# Patient Record
Sex: Female | Born: 1965 | Race: White | Hispanic: No | Marital: Married | Smoking: Current every day smoker
Health system: Southern US, Community
[De-identification: ages and names within clinical notes are randomized; demographics above are authoritative.]

## PROBLEM LIST (undated history)

## (undated) DIAGNOSIS — M419 Scoliosis, unspecified: Secondary | ICD-10-CM

## (undated) DIAGNOSIS — L719 Rosacea, unspecified: Secondary | ICD-10-CM

## (undated) DIAGNOSIS — Z91018 Allergy to other foods: Secondary | ICD-10-CM

## (undated) DIAGNOSIS — I10 Essential (primary) hypertension: Secondary | ICD-10-CM

## (undated) DIAGNOSIS — K769 Liver disease, unspecified: Secondary | ICD-10-CM

## (undated) HISTORY — PX: TUBAL LIGATION: SHX77

## (undated) HISTORY — PX: BREAST SURGERY: SHX581

## (undated) HISTORY — DX: Liver disease, unspecified: K76.9

---

## 2008-05-08 ENCOUNTER — Ambulatory Visit: Payer: Self-pay | Admitting: Obstetrics and Gynecology

## 2019-09-11 ENCOUNTER — Encounter: Payer: Self-pay | Admitting: Emergency Medicine

## 2019-09-11 ENCOUNTER — Inpatient Hospital Stay
Admission: EM | Admit: 2019-09-11 | Discharge: 2019-09-17 | DRG: 433 | Disposition: A | Discharge status: Home / Self Care | Payer: Managed Care, Other (non HMO) | Attending: Internal Medicine | Admitting: Internal Medicine

## 2019-09-11 ENCOUNTER — Other Ambulatory Visit: Payer: Self-pay

## 2019-09-11 ENCOUNTER — Emergency Department: Payer: Managed Care, Other (non HMO)

## 2019-09-11 DIAGNOSIS — K760 Fatty (change of) liver, not elsewhere classified: Secondary | ICD-10-CM | POA: Diagnosis present

## 2019-09-11 DIAGNOSIS — Z9851 Tubal ligation status: Secondary | ICD-10-CM

## 2019-09-11 DIAGNOSIS — F411 Generalized anxiety disorder: Secondary | ICD-10-CM | POA: Diagnosis present

## 2019-09-11 DIAGNOSIS — Z20822 Contact with and (suspected) exposure to covid-19: Secondary | ICD-10-CM | POA: Diagnosis present

## 2019-09-11 DIAGNOSIS — E876 Hypokalemia: Secondary | ICD-10-CM | POA: Diagnosis present

## 2019-09-11 DIAGNOSIS — Z79899 Other long term (current) drug therapy: Secondary | ICD-10-CM

## 2019-09-11 DIAGNOSIS — K7011 Alcoholic hepatitis with ascites: Secondary | ICD-10-CM | POA: Diagnosis not present

## 2019-09-11 DIAGNOSIS — E871 Hypo-osmolality and hyponatremia: Secondary | ICD-10-CM | POA: Diagnosis present

## 2019-09-11 DIAGNOSIS — F1020 Alcohol dependence, uncomplicated: Secondary | ICD-10-CM | POA: Diagnosis present

## 2019-09-11 DIAGNOSIS — Z7982 Long term (current) use of aspirin: Secondary | ICD-10-CM

## 2019-09-11 DIAGNOSIS — T380X5A Adverse effect of glucocorticoids and synthetic analogues, initial encounter: Secondary | ICD-10-CM | POA: Diagnosis present

## 2019-09-11 DIAGNOSIS — R17 Unspecified jaundice: Secondary | ICD-10-CM | POA: Diagnosis present

## 2019-09-11 DIAGNOSIS — K759 Inflammatory liver disease, unspecified: Secondary | ICD-10-CM

## 2019-09-11 DIAGNOSIS — E441 Mild protein-calorie malnutrition: Secondary | ICD-10-CM | POA: Diagnosis present

## 2019-09-11 DIAGNOSIS — Y904 Blood alcohol level of 80-99 mg/100 ml: Secondary | ICD-10-CM | POA: Diagnosis present

## 2019-09-11 DIAGNOSIS — K766 Portal hypertension: Secondary | ICD-10-CM | POA: Diagnosis present

## 2019-09-11 DIAGNOSIS — D6959 Other secondary thrombocytopenia: Secondary | ICD-10-CM | POA: Diagnosis present

## 2019-09-11 DIAGNOSIS — D72829 Elevated white blood cell count, unspecified: Secondary | ICD-10-CM | POA: Diagnosis present

## 2019-09-11 DIAGNOSIS — K7460 Unspecified cirrhosis of liver: Secondary | ICD-10-CM | POA: Diagnosis present

## 2019-09-11 DIAGNOSIS — Z6834 Body mass index (BMI) 34.0-34.9, adult: Secondary | ICD-10-CM

## 2019-09-11 DIAGNOSIS — R531 Weakness: Secondary | ICD-10-CM | POA: Diagnosis not present

## 2019-09-11 DIAGNOSIS — F17200 Nicotine dependence, unspecified, uncomplicated: Secondary | ICD-10-CM | POA: Diagnosis present

## 2019-09-11 DIAGNOSIS — N390 Urinary tract infection, site not specified: Secondary | ICD-10-CM | POA: Diagnosis present

## 2019-09-11 DIAGNOSIS — R7989 Other specified abnormal findings of blood chemistry: Secondary | ICD-10-CM

## 2019-09-11 DIAGNOSIS — D696 Thrombocytopenia, unspecified: Secondary | ICD-10-CM | POA: Diagnosis present

## 2019-09-11 DIAGNOSIS — K7010 Alcoholic hepatitis without ascites: Secondary | ICD-10-CM

## 2019-09-11 LAB — COMPREHENSIVE METABOLIC PANEL
ALT: 51 U/L — ABNORMAL HIGH (ref 0–44)
AST: 244 U/L — ABNORMAL HIGH (ref 15–41)
Albumin: 2.7 g/dL — ABNORMAL LOW (ref 3.5–5.0)
Alkaline Phosphatase: 203 U/L — ABNORMAL HIGH (ref 38–126)
Anion gap: 13 (ref 5–15)
BUN: 10 mg/dL (ref 6–20)
CO2: 23 mmol/L (ref 22–32)
Calcium: 8.1 mg/dL — ABNORMAL LOW (ref 8.9–10.3)
Chloride: 92 mmol/L — ABNORMAL LOW (ref 98–111)
Creatinine, Ser: UNDETERMINED mg/dL (ref 0.44–1.00)
Glucose, Bld: 103 mg/dL — ABNORMAL HIGH (ref 70–99)
Potassium: 3.1 mmol/L — ABNORMAL LOW (ref 3.5–5.1)
Sodium: 128 mmol/L — ABNORMAL LOW (ref 135–145)
Total Bilirubin: 28.1 mg/dL (ref 0.3–1.2)
Total Protein: 7.4 g/dL (ref 6.5–8.1)

## 2019-09-11 LAB — CBC WITH DIFFERENTIAL/PLATELET
Abs Immature Granulocytes: 0.18 10*3/uL — ABNORMAL HIGH (ref 0.00–0.07)
Basophils Absolute: 0.2 10*3/uL — ABNORMAL HIGH (ref 0.0–0.1)
Basophils Relative: 1 %
Eosinophils Absolute: 0.1 10*3/uL (ref 0.0–0.5)
Eosinophils Relative: 1 %
HCT: 36 % (ref 36.0–46.0)
Hemoglobin: 13.4 g/dL (ref 12.0–15.0)
Immature Granulocytes: 1 %
Lymphocytes Relative: 16 %
Lymphs Abs: 2.6 10*3/uL (ref 0.7–4.0)
MCH: 35.4 pg — ABNORMAL HIGH (ref 26.0–34.0)
MCHC: 37.2 g/dL — ABNORMAL HIGH (ref 30.0–36.0)
MCV: 95.2 fL (ref 80.0–100.0)
Monocytes Absolute: 2.2 10*3/uL — ABNORMAL HIGH (ref 0.1–1.0)
Monocytes Relative: 14 %
Neutro Abs: 10.7 10*3/uL — ABNORMAL HIGH (ref 1.7–7.7)
Neutrophils Relative %: 67 %
Platelets: 138 10*3/uL — ABNORMAL LOW (ref 150–400)
RBC: 3.78 MIL/uL — ABNORMAL LOW (ref 3.87–5.11)
RDW: 17.8 % — ABNORMAL HIGH (ref 11.5–15.5)
WBC: 15.9 10*3/uL — ABNORMAL HIGH (ref 4.0–10.5)
nRBC: 0 % (ref 0.0–0.2)

## 2019-09-11 LAB — URINALYSIS, COMPLETE (UACMP) WITH MICROSCOPIC
Glucose, UA: 50 mg/dL — AB
Hgb urine dipstick: NEGATIVE
Ketones, ur: 5 mg/dL — AB
Leukocytes,Ua: NEGATIVE
Nitrite: NEGATIVE
Protein, ur: 30 mg/dL — AB
Specific Gravity, Urine: 1.021 (ref 1.005–1.030)
pH: 6 (ref 5.0–8.0)

## 2019-09-11 LAB — PROTIME-INR
INR: 1.6 — ABNORMAL HIGH (ref 0.8–1.2)
Prothrombin Time: 18 seconds — ABNORMAL HIGH (ref 11.4–15.2)

## 2019-09-11 LAB — ETHANOL: Alcohol, Ethyl (B): 92 mg/dL — ABNORMAL HIGH (ref <10)

## 2019-09-11 LAB — ACETAMINOPHEN LEVEL: Acetaminophen (Tylenol), Serum: <10 ug/mL — ABNORMAL LOW (ref 10–30)

## 2019-09-11 LAB — POCT PREGNANCY, URINE: Preg Test, Ur: NEGATIVE

## 2019-09-11 LAB — LIPASE, BLOOD: Lipase: 134 U/L — ABNORMAL HIGH (ref 11–51)

## 2019-09-11 LAB — APTT: aPTT: 44 seconds — ABNORMAL HIGH (ref 24–36)

## 2019-09-11 MED ORDER — PREDNISONE 20 MG PO TABS: 60.0000 mg | ORAL_TABLET | Freq: Once | ORAL | Status: DC

## 2019-09-11 MED ORDER — PREDNISOLONE SODIUM PHOSPHATE 15 MG/5ML PO SOLN
40.0000 mg | Freq: Once | ORAL | Status: AC
  Administered 2019-09-11: 40 mg via ORAL
  Filled 2019-09-11: qty 3

## 2019-09-11 MED ORDER — SODIUM CHLORIDE 0.9 % IV BOLUS
1000.0000 mL | Freq: Once | INTRAVENOUS | Status: AC
  Administered 2019-09-11: 1000 mL via INTRAVENOUS

## 2019-09-11 NOTE — ED Provider Notes (Signed)
[facility] Emergency Department Provider Note   ____________________________________________   I have reviewed the triage vital signs and the nursing notes.   HISTORY  Chief Complaint Yellow skin  History limited by: Not Limited   HPI Marcia Strickland is a 54 y.o. female who presents to the emergency department today with primary concern for yellowing of her skin and around her eyes.  Patient states that her family finally convinced her to come in.  She states that the yellowing has been noticed for the past few weeks.  The patient says that she has noticed some abdominal distention over the past few months.  The patient has had some discomfort in her abdomen with this.  She has also felt some areas of firmness in her stomach.  The patient does have history of significant alcohol use.  She denies similar symptoms in the past.  Records reviewed. Per medical record review patient has a history of tubal ligation.  History reviewed. No pertinent past medical history.  There are no problems to display for this patient.   Past Surgical History:  Procedure Laterality Date  . TUBAL LIGATION      Prior to Admission medications   Not on File    Allergies Patient has no known allergies.  No family history on file.  Social History Social History   Tobacco Use  . Smoking status: Current Every Day Smoker  . Smokeless tobacco: Never Used  Substance Use Topics  . Alcohol use: Yes  . Drug use: Not on file    Review of Systems Constitutional: No fever/chills Eyes: Positive for yellowing around the eyes ENT: No sore throat. Cardiovascular: Denies chest pain. Respiratory: Denies shortness of breath. Gastrointestinal: Positive for abdominal distention, discomfort.   Genitourinary: Negative for dysuria. Musculoskeletal: Negative for back pain. Skin: Positive for yellowing of the skin. Neurological: Negative for headaches, focal weakness or  numbness.  ____________________________________________   PHYSICAL EXAM:  VITAL SIGNS: ED Triage Vitals  Enc Vitals Group     BP 09/11/19 1959 (!) 171/81     Pulse --      Resp 09/11/19 1959 18     Temp 09/11/19 1959 99.8 F (37.7 C)     Temp Source 09/11/19 1959 Oral     SpO2 09/11/19 1959 97 %     Weight 09/11/19 1959 190 lb (86.2 kg)     Height 09/11/19 1959 5' 2"  (1.575 m)     Head Circumference --      Peak Flow --      Pain Score 09/11/19 1958 0   Constitutional: Alert and oriented.  Eyes: Scleral icterus.  ENT      Head: Normocephalic and atraumatic.      Nose: No congestion/rhinnorhea.      Mouth/Throat: Mucous membranes are moist.      Neck: No stridor. Hematological/Lymphatic/Immunilogical: No cervical lymphadenopathy. Cardiovascular: Normal rate, regular rhythm.  No murmurs, rubs, or gallops. Respiratory: Normal respiratory effort without tachypnea nor retractions. Breath sounds are clear and equal bilaterally. No wheezes/rales/rhonchi. Gastrointestinal: Soft and slightly distended. Slightly tender in the upper abdomen.  Genitourinary: Deferred Musculoskeletal: Normal range of motion in all extremities. No lower extremity edema. Neurologic:  Normal speech and language. No gross focal neurologic deficits are appreciated.  Skin:  Skin is warm, dry and intact. No rash noted. Psychiatric: Mood and affect are normal. Speech and behavior are normal. Patient exhibits appropriate insight and judgment.  ____________________________________________    LABS (pertinent positives/negatives)  Lipase  134 Ethanol 92 CMP na 128, k 3.1, glu 103, ca 8.1, ast 244, alk phos 203, t bili 28.1 CBC wbc 15.9, hgb 13.4, plt 138 UA cloudy, moderate bilirubin, 21-50 wbc, many bacteria, 11-20 squamous epi  ____________________________________________   EKG  None  ____________________________________________    RADIOLOGY  CT abd pel Consistent with hepatic steatosis.  Concern for portal vein hypertension. Ascitis  ____________________________________________   PROCEDURES  Procedures  ____________________________________________   INITIAL IMPRESSION / ASSESSMENT AND PLAN / ED COURSE  Pertinent labs & imaging results that were available during my care of the patient were reviewed by me and considered in my medical decision making (see chart for details).   Patient presented to the emergency department today because of concerns for jaundice.  Patient has significant alcohol use history.  Bilirubin was significantly elevated to 28.  I did obtain a CT scan given concern for possible obstructive lesion.  This did not show any clear obstruction.  I do think this is likely alcoholic hepatitis.  Discussed with Dr. Allen Norris with GI.  Discriminant function was elevated so steroids were started. Discussed findings with patient. Will admit for further work up and management.   ____________________________________________   FINAL CLINICAL IMPRESSION(S) / ED DIAGNOSES  Final diagnoses:  Jaundice  Alcoholic hepatitis, unspecified whether ascites present     Note: This dictation was prepared with Dragon dictation. Any transcriptional errors that result from this process are unintentional     Nance Pear, MD 09/12/19 1600

## 2019-09-11 NOTE — ED Notes (Signed)
Pt to CT scan.

## 2019-09-11 NOTE — ED Triage Notes (Addendum)
Patient ambulatory to triage with steady gait, without difficulty or distress noted; pt reports yellowing of skin and eyes x 2wks; unable to get appt until July; denies pain but reports abd bloating; daily ETOH use

## 2019-09-12 ENCOUNTER — Encounter: Payer: Self-pay | Admitting: Internal Medicine

## 2019-09-12 ENCOUNTER — Inpatient Hospital Stay: Payer: Managed Care, Other (non HMO)

## 2019-09-12 DIAGNOSIS — D72829 Elevated white blood cell count, unspecified: Secondary | ICD-10-CM | POA: Diagnosis present

## 2019-09-12 DIAGNOSIS — Z6834 Body mass index (BMI) 34.0-34.9, adult: Secondary | ICD-10-CM | POA: Diagnosis not present

## 2019-09-12 DIAGNOSIS — K759 Inflammatory liver disease, unspecified: Secondary | ICD-10-CM | POA: Diagnosis not present

## 2019-09-12 DIAGNOSIS — N390 Urinary tract infection, site not specified: Secondary | ICD-10-CM

## 2019-09-12 DIAGNOSIS — Z9851 Tubal ligation status: Secondary | ICD-10-CM | POA: Diagnosis not present

## 2019-09-12 DIAGNOSIS — K7460 Unspecified cirrhosis of liver: Secondary | ICD-10-CM | POA: Diagnosis present

## 2019-09-12 DIAGNOSIS — D6959 Other secondary thrombocytopenia: Secondary | ICD-10-CM | POA: Diagnosis present

## 2019-09-12 DIAGNOSIS — E871 Hypo-osmolality and hyponatremia: Secondary | ICD-10-CM | POA: Diagnosis present

## 2019-09-12 DIAGNOSIS — K7011 Alcoholic hepatitis with ascites: Secondary | ICD-10-CM | POA: Diagnosis present

## 2019-09-12 DIAGNOSIS — F10230 Alcohol dependence with withdrawal, uncomplicated: Secondary | ICD-10-CM | POA: Diagnosis not present

## 2019-09-12 DIAGNOSIS — K7010 Alcoholic hepatitis without ascites: Secondary | ICD-10-CM | POA: Diagnosis not present

## 2019-09-12 DIAGNOSIS — F1020 Alcohol dependence, uncomplicated: Secondary | ICD-10-CM | POA: Diagnosis present

## 2019-09-12 DIAGNOSIS — E441 Mild protein-calorie malnutrition: Secondary | ICD-10-CM | POA: Diagnosis present

## 2019-09-12 DIAGNOSIS — R531 Weakness: Secondary | ICD-10-CM | POA: Diagnosis present

## 2019-09-12 DIAGNOSIS — R17 Unspecified jaundice: Secondary | ICD-10-CM | POA: Diagnosis present

## 2019-09-12 DIAGNOSIS — F17200 Nicotine dependence, unspecified, uncomplicated: Secondary | ICD-10-CM | POA: Diagnosis present

## 2019-09-12 DIAGNOSIS — K766 Portal hypertension: Secondary | ICD-10-CM | POA: Diagnosis present

## 2019-09-12 DIAGNOSIS — Z20822 Contact with and (suspected) exposure to covid-19: Secondary | ICD-10-CM | POA: Diagnosis present

## 2019-09-12 DIAGNOSIS — F411 Generalized anxiety disorder: Secondary | ICD-10-CM | POA: Diagnosis present

## 2019-09-12 DIAGNOSIS — T380X5A Adverse effect of glucocorticoids and synthetic analogues, initial encounter: Secondary | ICD-10-CM | POA: Diagnosis present

## 2019-09-12 DIAGNOSIS — Y904 Blood alcohol level of 80-99 mg/100 ml: Secondary | ICD-10-CM | POA: Diagnosis present

## 2019-09-12 DIAGNOSIS — Z7982 Long term (current) use of aspirin: Secondary | ICD-10-CM | POA: Diagnosis not present

## 2019-09-12 DIAGNOSIS — K760 Fatty (change of) liver, not elsewhere classified: Secondary | ICD-10-CM | POA: Diagnosis present

## 2019-09-12 DIAGNOSIS — E876 Hypokalemia: Secondary | ICD-10-CM | POA: Diagnosis present

## 2019-09-12 DIAGNOSIS — Z79899 Other long term (current) drug therapy: Secondary | ICD-10-CM | POA: Diagnosis not present

## 2019-09-12 LAB — CBC WITH DIFFERENTIAL/PLATELET
Abs Immature Granulocytes: 0.19 10*3/uL — ABNORMAL HIGH (ref 0.00–0.07)
Basophils Absolute: 0.1 10*3/uL (ref 0.0–0.1)
Basophils Relative: 1 %
Eosinophils Absolute: 0 10*3/uL (ref 0.0–0.5)
Eosinophils Relative: 0 %
HCT: 31.8 % — ABNORMAL LOW (ref 36.0–46.0)
Hemoglobin: 11.8 g/dL — ABNORMAL LOW (ref 12.0–15.0)
Immature Granulocytes: 2 %
Lymphocytes Relative: 9 %
Lymphs Abs: 1.1 10*3/uL (ref 0.7–4.0)
MCH: 35.2 pg — ABNORMAL HIGH (ref 26.0–34.0)
MCHC: 37.1 g/dL — ABNORMAL HIGH (ref 30.0–36.0)
MCV: 94.9 fL (ref 80.0–100.0)
Monocytes Absolute: 1.6 10*3/uL — ABNORMAL HIGH (ref 0.1–1.0)
Monocytes Relative: 13 %
Neutro Abs: 9.7 10*3/uL — ABNORMAL HIGH (ref 1.7–7.7)
Neutrophils Relative %: 75 %
Platelets: 131 10*3/uL — ABNORMAL LOW (ref 150–400)
RBC: 3.35 MIL/uL — ABNORMAL LOW (ref 3.87–5.11)
RDW: 18.6 % — ABNORMAL HIGH (ref 11.5–15.5)
WBC: 12.7 10*3/uL — ABNORMAL HIGH (ref 4.0–10.5)
nRBC: 0 % (ref 0.0–0.2)

## 2019-09-12 LAB — CBC
HCT: 33.3 % — ABNORMAL LOW (ref 36.0–46.0)
Hemoglobin: 12.3 g/dL (ref 12.0–15.0)
MCH: 35.3 pg — ABNORMAL HIGH (ref 26.0–34.0)
MCHC: 36.9 g/dL — ABNORMAL HIGH (ref 30.0–36.0)
MCV: 95.7 fL (ref 80.0–100.0)
Platelets: 129 10*3/uL — ABNORMAL LOW (ref 150–400)
RBC: 3.48 MIL/uL — ABNORMAL LOW (ref 3.87–5.11)
RDW: 18 % — ABNORMAL HIGH (ref 11.5–15.5)
WBC: 11 10*3/uL — ABNORMAL HIGH (ref 4.0–10.5)
nRBC: 0 % (ref 0.0–0.2)

## 2019-09-12 LAB — COMPREHENSIVE METABOLIC PANEL
ALT: 49 U/L — ABNORMAL HIGH (ref 0–44)
AST: 212 U/L — ABNORMAL HIGH (ref 15–41)
Albumin: 2.3 g/dL — ABNORMAL LOW (ref 3.5–5.0)
Alkaline Phosphatase: 166 U/L — ABNORMAL HIGH (ref 38–126)
Anion gap: 10 (ref 5–15)
BUN: 8 mg/dL (ref 6–20)
CO2: 24 mmol/L (ref 22–32)
Calcium: 7.7 mg/dL — ABNORMAL LOW (ref 8.9–10.3)
Chloride: 97 mmol/L — ABNORMAL LOW (ref 98–111)
Creatinine, Ser: UNDETERMINED mg/dL (ref 0.44–1.00)
Glucose, Bld: 127 mg/dL — ABNORMAL HIGH (ref 70–99)
Potassium: 3.3 mmol/L — ABNORMAL LOW (ref 3.5–5.1)
Sodium: 131 mmol/L — ABNORMAL LOW (ref 135–145)
Total Bilirubin: 26.4 mg/dL (ref 0.3–1.2)
Total Protein: 6.6 g/dL (ref 6.5–8.1)

## 2019-09-12 LAB — HEPATITIS PANEL, ACUTE
HCV Ab: NONREACTIVE
Hep A IgM: NONREACTIVE
Hep B C IgM: NONREACTIVE
Hepatitis B Surface Ag: NONREACTIVE

## 2019-09-12 LAB — MAGNESIUM: Magnesium: 1.7 mg/dL (ref 1.7–2.4)

## 2019-09-12 LAB — SARS CORONAVIRUS 2 BY RT PCR (HOSPITAL ORDER, PERFORMED IN ~~LOC~~ HOSPITAL LAB): SARS Coronavirus 2: NEGATIVE

## 2019-09-12 LAB — HIV ANTIBODY (ROUTINE TESTING W REFLEX): HIV Screen 4th Generation wRfx: NONREACTIVE

## 2019-09-12 MED ORDER — FOLIC ACID 1 MG PO TABS
1.0000 mg | ORAL_TABLET | Freq: Every day | ORAL | Status: DC
  Administered 2019-09-12 – 2019-09-17 (×6): 1 mg via ORAL
  Filled 2019-09-12 (×6): qty 1

## 2019-09-12 MED ORDER — LORAZEPAM 2 MG PO TABS
0.0000 mg | ORAL_TABLET | Freq: Four times a day (QID) | ORAL | Status: AC
  Administered 2019-09-12: 1 mg via ORAL

## 2019-09-12 MED ORDER — POTASSIUM CHLORIDE CRYS ER 20 MEQ PO TBCR
40.0000 meq | EXTENDED_RELEASE_TABLET | ORAL | Status: AC
  Administered 2019-09-12 (×2): 40 meq via ORAL
  Filled 2019-09-12 (×2): qty 2

## 2019-09-12 MED ORDER — LORAZEPAM 2 MG PO TABS: 0.0000 mg | ORAL_TABLET | Freq: Two times a day (BID) | ORAL | Status: AC

## 2019-09-12 MED ORDER — TRAMADOL HCL 50 MG PO TABS: 50.0000 mg | ORAL_TABLET | Freq: Three times a day (TID) | ORAL | Status: DC | PRN

## 2019-09-12 MED ORDER — LORAZEPAM 1 MG PO TABS
1.0000 mg | ORAL_TABLET | ORAL | Status: AC | PRN
  Filled 2019-09-12: qty 1

## 2019-09-12 MED ORDER — SODIUM CHLORIDE 0.9 % IV SOLN
1.0000 g | INTRAVENOUS | Status: DC
  Administered 2019-09-12 – 2019-09-13 (×2): 1 g via INTRAVENOUS
  Filled 2019-09-12: qty 1
  Filled 2019-09-12: qty 10

## 2019-09-12 MED ORDER — CIPROFLOXACIN HCL 500 MG PO TABS
500.0000 mg | ORAL_TABLET | Freq: Two times a day (BID) | ORAL | Status: DC
  Administered 2019-09-12: 500 mg via ORAL
  Filled 2019-09-12: qty 1

## 2019-09-12 MED ORDER — PREDNISOLONE 5 MG PO TABS: 40.0000 mg | ORAL_TABLET | Freq: Every day | ORAL | Status: DC

## 2019-09-12 MED ORDER — ADULT MULTIVITAMIN W/MINERALS CH
1.0000 | ORAL_TABLET | Freq: Every day | ORAL | Status: DC
  Administered 2019-09-12 – 2019-09-17 (×6): 1 via ORAL
  Filled 2019-09-12 (×6): qty 1

## 2019-09-12 MED ORDER — PREDNISOLONE SODIUM PHOSPHATE 15 MG/5ML PO SOLN
40.0000 mg | Freq: Every day | ORAL | Status: DC
  Administered 2019-09-12 – 2019-09-17 (×6): 40 mg via ORAL
  Filled 2019-09-12 (×6): qty 13.33

## 2019-09-12 MED ORDER — THIAMINE HCL 100 MG PO TABS
100.0000 mg | ORAL_TABLET | Freq: Every day | ORAL | Status: DC
  Administered 2019-09-12 – 2019-09-17 (×6): 100 mg via ORAL
  Filled 2019-09-12 (×6): qty 1

## 2019-09-12 MED ORDER — DEXTROSE-NACL 5-0.45 % IV SOLN: INTRAVENOUS | Status: AC

## 2019-09-12 MED ORDER — LORAZEPAM 2 MG/ML IJ SOLN: 1.0000 mg | INTRAMUSCULAR | Status: AC | PRN

## 2019-09-12 MED ORDER — SENNA 8.6 MG PO TABS
1.0000 | ORAL_TABLET | Freq: Two times a day (BID) | ORAL | Status: DC
  Administered 2019-09-12 – 2019-09-17 (×8): 8.6 mg via ORAL
  Filled 2019-09-12 (×9): qty 1

## 2019-09-12 MED ORDER — ONDANSETRON HCL 4 MG/2ML IJ SOLN: 4.0000 mg | Freq: Four times a day (QID) | INTRAMUSCULAR | Status: DC | PRN

## 2019-09-12 NOTE — H&P (Addendum)
History and Physical    Marcia Strickland CVE:[id number] DOB: Oct 20, 1965 DOA: 09/11/2019  PCP: Patient, No Pcp Per (Confirm with patient/family/NH records and if not entered, this has to be entered at [facility] point of entry) Patient coming from: home  I have personally briefly reviewed patient's old medical records in [place]  Chief Complaint: jaundice, weakness, abdominal distention  HPI: Marcia Strickland is a 54 y.o. female with no significant medical history. For several weeks she has been feeling like she had the flu. Her family noticed progressive jaundice along with weakness. She has a decline in appetite with poor PO intake food and fluids. She reports no acute focal symptoms. Due to jaundice and weakness she presents to [facility] ED for evaluation. She has no prior history of liver disease. She has had not toxic exposure. She does admit to consuming 1 pint/week of vodka.  ED Course: Hemodynamically stable. Patient is jaundiced. Lab revealed elevated PT, APTT, INR, K 3.1, alk phos 203, lipase 134,  AST 244, ALT 51, T Bili 28.1. Leukocytosis to 15.9. U/A positive with 21-50 WBC/hpf, many bacteria. EtOH 92, CT reveals fatty liver, venous hypertension with varices. Dr. Allen Norris consulted by EDP - recommended steroids and consult 09/12/19.  TRH asked to admit and manage acute care.   Review of Systems: As per HPI otherwise 10 point review of systems negative.    History reviewed. No pertinent past medical history.  Past Surgical History:  Procedure Laterality Date  . BREAST SURGERY     implants bilaterally.   . TUBAL LIGATION     Soc Hx -  Married 29 years, 3 dtrs (one deceased), 2 sons, 4 grandchildren. 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 Husband is on disability. They in their own home with 4 "fur babies." Duaghter is present and supportive    reports that she has been smoking. She has never used smokeless tobacco. She reports current alcohol use. No history on file for drug.  No Known  Allergies  Family History  Problem Relation Age of Onset  . Breast cancer Mother   . Kidney failure Father   . Breast cancer Maternal Grandmother   . Leukemia Maternal Grandfather   . Diabetes Maternal Grandfather   . Hypertension Maternal Grandfather   . CAD Paternal Grandfather   . Peripheral Artery Disease Paternal Grandfather      Prior to Admission medications   Medication Sig Start Date End Date Taking? Authorizing Provider  aspirin EC 81 MG tablet Take 81 mg by mouth daily.   Yes [provider]  diphenhydrAMINE HCl (BENADRYL ALLERGY PO) Take 25 mg by mouth daily.   Yes [provider]  ferrous sulfate 325 (65 FE) MG tablet Take 325 mg by mouth daily with breakfast.   Yes [provider]  Potassium 99 MG TABS Take 1 tablet by mouth every Friday.   Yes [provider]  Probiotic Product (PROBIOTIC ACIDOPHILUS BIOBEADS PO) Take 1 capsule by mouth daily.   Yes [provider]  Turmeric (QC TUMERIC COMPLEX PO) Take by mouth.   Yes [provider]  vitamin B-12 (CYANOCOBALAMIN) 100 MCG tablet Take 100 mcg by mouth daily.   Yes [provider]    Physical Exam: Vitals:   09/12/19 0000 09/12/19 0030 09/12/19 0100 09/12/19 0130  BP: (!) 148/87 137/76 (!) 153/84 (!) 167/89  Pulse: (!) 107 (!) 105 (!) 102 (!) 108  Resp:   16 16  Temp:  TempSrc:      SpO2: 94% 94% 94% 94%  Weight:      Height:        Constitutional: NAD, calm, comfortable Vitals:   09/12/19 0000 09/12/19 0030 09/12/19 0100 09/12/19 0130  BP: (!) 148/87 137/76 (!) 153/84 (!) 167/89  Pulse: (!) 107 (!) 105 (!) 102 (!) 108  Resp:   16 16  Temp:      TempSrc:      SpO2: 94% 94% 94% 94%  Weight:      Height:       General: overweight woman, very jaundiced but in no distress Eyes: PERRL, lids normal, scleral icterus ENMT: Mucous membranes are moist. Posterior pharynx clear of any exudate or lesions.Normal dentition.  Neck: normal, supple,  no masses, no thyromegaly Respiratory: clear to auscultation bilaterally, no wheezing, no crackles. Normal respiratory effort. No accessory muscle use.  Cardiovascular: Regular tachycardia, no murmurs / rubs / gallops. No extremity edema. 2+ pedal pulses. No carotid bruits.  Abdomen: distended, no shifting fluid wave,no tenderness, no masses palpated.  Bowel sounds positive.  Musculoskeletal: no clubbing / cyanosis. No joint deformity upper and lower extremities. Good ROM, no contractures. Normal muscle tone.  Skin: no rashes, lesions, ulcers. No induration. Icteric Neurologic: CN 2-12 grossly intact. Sensation intact, DTR normal. Strength 5/5 in all 4.  Psychiatric: Normal judgment and insight. Alert and oriented x 3. Normal mood.     Labs on Admission: I have personally reviewed following labs and imaging studies  CBC: Recent Labs  Lab 09/11/19 2004  WBC 15.9*  NEUTROABS 10.7*  HGB 13.4  HCT 36.0  MCV 95.2  PLT 149*   Basic Metabolic Panel: Recent Labs  Lab 09/11/19 2004  NA 128*  K 3.1*  CL 92*  CO2 23  GLUCOSE 103*  BUN 10  CREATININE UNABLE TO RESULT DUE TO ICTERUS  CALCIUM 8.1*   GFR: CrCl cannot be calculated (This lab value cannot be used to calculate CrCl because it is not a number: UNABLE TO RESULT DUE TO ICTERUS). Liver Function Tests: Recent Labs  Lab 09/11/19 2004  AST 244*  ALT 51*  ALKPHOS 203*  BILITOT 28.1*  PROT 7.4  ALBUMIN 2.7*   Recent Labs  Lab 09/11/19 2004  LIPASE 134*   No results for input(s): AMMONIA in the last 168 hours. Coagulation Profile: Recent Labs  Lab 09/11/19 2231  INR 1.6*   Cardiac Enzymes: No results for input(s): CKTOTAL, CKMB, CKMBINDEX, TROPONINI in the last 168 hours. BNP (last 3 results) No results for input(s): PROBNP in the last 8760 hours. HbA1C: No results for input(s): HGBA1C in the last 72 hours. CBG: No results for input(s): GLUCAP in the last 168 hours. Lipid Profile: No results for input(s):  CHOL, HDL, LDLCALC, TRIG, CHOLHDL, LDLDIRECT in the last 72 hours. Thyroid Function Tests: No results for input(s): TSH, T4TOTAL, FREET4, T3FREE, THYROIDAB in the last 72 hours. Anemia Panel: No results for input(s): VITAMINB12, FOLATE, FERRITIN, TIBC, IRON, RETICCTPCT in the last 72 hours. Urine analysis:    Component Value Date/Time   COLORURINE AMBER (A) 09/11/2019 2004   APPEARANCEUR CLOUDY (A) 09/11/2019 2004   LABSPEC 1.021 09/11/2019 2004   PHURINE 6.0 09/11/2019 2004   GLUCOSEU 50 (A) 09/11/2019 2004   HGBUR NEGATIVE 09/11/2019 2004   BILIRUBINUR MODERATE (A) 09/11/2019 2004   KETONESUR 5 (A) 09/11/2019 2004   PROTEINUR 30 (A) 09/11/2019 2004   NITRITE NEGATIVE 09/11/2019 2004   LEUKOCYTESUR NEGATIVE 09/11/2019 2004  Radiological Exams on Admission: CT ABDOMEN PELVIS WO CONTRAST  Result Date: 09/11/2019 CLINICAL DATA:  Jaundice for 2 weeks, alcohol abuse EXAM: CT ABDOMEN AND PELVIS WITHOUT CONTRAST TECHNIQUE: Multidetector CT imaging of the abdomen and pelvis was performed following the standard protocol without IV contrast. Unenhanced CT was performed per clinician order. Lack of IV contrast limits sensitivity and specificity, especially for evaluation of abdominal/pelvic solid viscera. COMPARISON:  None. FINDINGS: Lower chest: No acute pleural or parenchymal lung disease. Hepatobiliary: Heterogeneous decreased attenuation consistent with background hepatic steatosis. There are likely scattered areas of fatty sparing. If focal liver parenchymal abnormality is suspected or report to document, dedicated liver MRI or CT may be useful. Gallbladder is decompressed. Pancreas: Unremarkable. No pancreatic ductal dilatation or surrounding inflammatory changes. Spleen: Normal in size without focal abnormality. Adrenals/Urinary Tract: No urinary tract calculi or obstructive uropathy. The adrenals are normal. The bladder is unremarkable. Stomach/Bowel: No bowel obstruction or ileus. Normal  gas-filled appendix right lower quadrant. No bowel wall thickening or inflammatory change. Vascular/Lymphatic: Evaluation of the vasculature is limited without IV contrast. Numerous varices are seen within the left upper quadrant. There is apparent recanalization of the umbilical vein. Minimal atherosclerosis of the aorta.  No pathologic adenopathy. Reproductive: Uterus and bilateral adnexa are unremarkable. Other: Small volume ascites throughout the abdomen and pelvis. No free intra-abdominal gas. Fat containing supraumbilical and umbilical hernias are noted. No bowel herniation. Musculoskeletal: No acute or destructive bony lesions. Reconstructed images demonstrate no additional findings. IMPRESSION: 1. Heterogeneous decreased attenuation of the liver, consistent with background hepatic steatosis. If focal liver parenchymal abnormality is suspected or clinically important to document, follow-up nonemergent dedicated liver MRI or CT could be considered. 2. Evidence of portal venous hypertension with left upper quadrant varices and recanalization of the umbilical vein. 3. Small volume ascites. 4. Fat containing umbilical and midline ventral hernias. No bowel herniation. Electronically Signed   By: Randa Ngo M.D.   On: 09/11/2019 22:49    EKG: Independently reviewed. No EKG done in ED  Assessment/Plan Active Problems:   Jaundice   Alcoholic hepatitis with ascites   Acute lower UTI   Hepatitis  (please populate well all problems here in Problem List. (For example, if patient is on BP meds at home and you resume or decide to hold them, it is a problem that needs to be her. Same for CAD, COPD, HLD and so on)   1. Alcoholic hepatitis with ascites and jaundice - leading cause but need to r/o infectious etiology, obstructive etiology. Suspect advance cirrhosis based on CT findings of portal hypertension and ascites. Elevated coags poor prognostic sign.  Plan Med-surg admit  Hepatitis panel  US abdomen  with elastography to gauge degree of cirrhosis and amount of ascites  May need paracentesis - patient endorses SOB  GI consult - may need MRCP vs ERCP vs other  IVF  hydration  Zofran for nausea  2. EtOH abuse - patient has elevated EtOH level. Last drink was on the way to hospital. IN the last year she has not gone a done w/o EtOH Plan  Alcohol withdrawal protocol  3. UTI - patient with leukocytosis and positive U/A Plan Urine for culture  Cipro 500 mg bid    DVT prophylaxis: SCDs  Code Status: full code Family Communication: daughter present during exam. Patient asked her husband not be awakened Disposition Plan: TBD  Consults called: Dr. Allen Norris - GI  Admission status: inpatient   Adella Hare MD [facility] Pager [phone]  If 7PM-7AM, please contact night-coverage [email or web address] Password [email or web address]  09/12/2019, 2:25 AM

## 2019-09-12 NOTE — ED Notes (Signed)
This RN to bedside, introduced self to patient and daughter. Pt resting in bed with lights dimmed for comfort. Pt denies any needs at this time. This RN reinforced with patient NPO status until 0910 for [place]. Pt and daughter states understanding at this time, denies further needs. Call bell within reach at this time.

## 2019-09-12 NOTE — ED Notes (Signed)
Pt aware she is NPO until 9:10 for [place].

## 2019-09-12 NOTE — ED Notes (Signed)
Pt given remote at this time  

## 2019-09-12 NOTE — ED Notes (Signed)
US at bedside at this time 

## 2019-09-12 NOTE — Consult Note (Addendum)
Marcia Antigua, MD [address] Marcia Strickland, [place], [place], [address] [address], [place], [place], [place], [address] Phone: [phone]  Fax: [phone]  Consultation  Referring Provider:     Dr. Archie Balboa Primary Care Physician:  Patient, No Pcp Per Reason for Consultation:     Hepatitis  Date of Admission:  09/11/2019 Date of Consultation:  09/12/2019         HPI:   Marcia Strickland is a 54 y.o. female presents with jaundice and fatigue for 1 week with no previous history of the same. No abdominal pain. Reports drinking 2-3 shots of liquor on weekdays, and 3-5 on weekends for the past year. No new medications or heavy tylenol use. Acutely elevated liver enzymes on admission. No confusion or bleeding. No prior colonoscopy. No family history of GI cancers  History reviewed. No pertinent past medical history.  Past Surgical History:  Procedure Laterality Date  . BREAST SURGERY     implants bilaterally.   . TUBAL LIGATION      Prior to Admission medications   Medication Sig Start Date End Date Taking? Authorizing Provider  aspirin EC 81 MG tablet Take 81 mg by mouth daily.   Yes [provider]  diphenhydrAMINE HCl (BENADRYL ALLERGY PO) Take 25 mg by mouth daily.   Yes [provider]  ferrous sulfate 325 (65 FE) MG tablet Take 325 mg by mouth daily with breakfast.   Yes [provider]  Potassium 99 MG TABS Take 1 tablet by mouth every Friday.   Yes [provider]  Probiotic Product (PROBIOTIC ACIDOPHILUS BIOBEADS PO) Take 1 capsule by mouth daily.   Yes [provider]  Turmeric (QC TUMERIC COMPLEX PO) Take by mouth.   Yes [provider]  vitamin B-12 (CYANOCOBALAMIN) 100 MCG tablet Take 100 mcg by mouth daily.   Yes [provider]    Family History  Problem Relation Age of Onset  . Breast cancer Mother   . Kidney failure Father   . Breast cancer Maternal Grandmother   . Leukemia Maternal Grandfather    . Diabetes Maternal Grandfather   . Hypertension Maternal Grandfather   . CAD Paternal Grandfather   . Peripheral Artery Disease Paternal Grandfather      Social History   Tobacco Use  . Smoking status: Current Every Day Smoker  . Smokeless tobacco: Never Used  Substance Use Topics  . Alcohol use: Yes  . Drug use: Not on file    Allergies as of 09/11/2019  . (No Known Allergies)    Review of Systems:    All systems reviewed and negative except where noted in HPI.   Physical Exam:  Vital signs in last 24 hours: Vitals:   09/12/19 0543 09/12/19 0845 09/12/19 0846 09/12/19 0950  BP: (!) 154/89 (!) 141/78 (!) 141/78   Pulse: (!) 57 (!) 110 (!) 110 (!) 102  Resp: 16 18    Temp:  98.4 F (36.9 C)    TempSrc:  Oral    SpO2: 93% 95%    Weight:      Height:         General:   Pleasant, cooperative in NAD Head:  Normocephalic and atraumatic. Eyes:   scleral icterus.   Conjunctiva pink. PERRLA. Ears:  Normal auditory acuity. Neck:  Supple; no masses or thyroidomegaly Lungs: Respirations even and unlabored. Lungs clear to auscultation bilaterally.   No wheezes, crackles, or rhonchi.  Abdomen:  Soft, nondistended, nontender. Normal  bowel sounds. No appreciable masses or hepatomegaly.  No rebound or guarding.  Neurologic:  Alert and oriented x3;  grossly normal neurologically. Skin:  Jaundice, Intact without significant lesions or rashes. Cervical Nodes:  No significant cervical adenopathy. Psych:  Alert and cooperative. Normal affect.  LAB RESULTS: Recent Labs    09/11/19 2004 09/12/19 0538  WBC 15.9* 11.0*  HGB 13.4 12.3  HCT 36.0 33.3*  PLT 138* 129*   BMET Recent Labs    09/11/19 2004 09/12/19 0538  NA 128* 131*  K 3.1* 3.3*  CL 92* 97*  CO2 23 24  GLUCOSE 103* 127*  BUN 10 8  CREATININE UNABLE TO RESULT DUE TO ICTERUS 0.32*  CALCIUM 8.1* 7.7*   LFT Recent Labs    09/12/19 0538  PROT 6.6  ALBUMIN 2.3*  AST 212*  ALT 49*  ALKPHOS 166*    BILITOT 26.4*   PT/INR Recent Labs    09/11/19 2231  LABPROT 18.0*  INR 1.6*    STUDIES: CT ABDOMEN PELVIS WO CONTRAST  Result Date: 09/11/2019 CLINICAL DATA:  Jaundice for 2 weeks, alcohol abuse EXAM: CT ABDOMEN AND PELVIS WITHOUT CONTRAST TECHNIQUE: Multidetector CT imaging of the abdomen and pelvis was performed following the standard protocol without IV contrast. Unenhanced CT was performed per clinician order. Lack of IV contrast limits sensitivity and specificity, especially for evaluation of abdominal/pelvic solid viscera. COMPARISON:  None. FINDINGS: Lower chest: No acute pleural or parenchymal lung disease. Hepatobiliary: Heterogeneous decreased attenuation consistent with background hepatic steatosis. There are likely scattered areas of fatty sparing. If focal liver parenchymal abnormality is suspected or report to document, dedicated liver MRI or CT may be useful. Gallbladder is decompressed. Pancreas: Unremarkable. No pancreatic ductal dilatation or surrounding inflammatory changes. Spleen: Normal in size without focal abnormality. Adrenals/Urinary Tract: No urinary tract calculi or obstructive uropathy. The adrenals are normal. The bladder is unremarkable. Stomach/Bowel: No bowel obstruction or ileus. Normal gas-filled appendix right lower quadrant. No bowel wall thickening or inflammatory change. Vascular/Lymphatic: Evaluation of the vasculature is limited without IV contrast. Numerous varices are seen within the left upper quadrant. There is apparent recanalization of the umbilical vein. Minimal atherosclerosis of the aorta.  No pathologic adenopathy. Reproductive: Uterus and bilateral adnexa are unremarkable. Other: Small volume ascites throughout the abdomen and pelvis. No free intra-abdominal gas. Fat containing supraumbilical and umbilical hernias are noted. No bowel herniation. Musculoskeletal: No acute or destructive bony lesions. Reconstructed images demonstrate no additional  findings. IMPRESSION: 1. Heterogeneous decreased attenuation of the liver, consistent with background hepatic steatosis. If focal liver parenchymal abnormality is suspected or clinically important to document, follow-up nonemergent dedicated liver MRI or CT could be considered. 2. Evidence of portal venous hypertension with left upper quadrant varices and recanalization of the umbilical vein. 3. Small volume ascites. 4. Fat containing umbilical and midline ventral hernias. No bowel herniation. Electronically Signed   By: Randa Ngo M.D.   On: 09/11/2019 22:49      Impression / Plan:   NADEAN MONTANARO is a 54 y.o. y/o female with daily alcohol use admitted with jaundice   Findings consistent with alcoholic hepatitis  DF is 41.8 and thus pt qualifies for steroids.   Would recommend prednisolone 65m daily and assess for response. No evidence of infection at this time or any contraindications present  Acute hep panel pending. Tylenol level low  Avoid hepatotoxic drugs  UKorearesults pending  Absence of abdominal pain and improving bilirubin from yesterday with mild elevation only  in alk phos all suggests against any evidence of bile duct obstruction. If these symptoms develops or if bilirubin does not improve as expected, can consider MRCP.   Pt has low platelets and CT with showing evidence of portal venous hypertension, therefore likely cirrhotic as well  Encourage abstinence CIWA protocol Folate, thiamine  Pt will need close follow up in GI clinic on discharge as well  Thank you for involving me in the care of this patient.      LOS: 0 days   Virgel Manifold, MD  09/12/2019, 12:42 PM

## 2019-09-12 NOTE — ED Notes (Signed)
Medications administered per order, pt tolerated well, CIWA re-assessed at this time, noted decrease from 5 to 1. Pt resting in bed with husband at bedside. Pt denies any needs, HR noted to decrease from 110 to 102, remains ST at this time. Call remains within reach of patient at this time, states understanding to call out if any needs arise.

## 2019-09-12 NOTE — ED Notes (Signed)
Admitting MD at bedside.

## 2019-09-12 NOTE — ED Notes (Signed)
This RN to bedside, medications administered per order, pt noted to have CIWA of 5 at this time. This RN explained CIWA protocol to patient and husband. Pt and husband state understanding. Pt able to tolerate PO without difficulty at this time.

## 2019-09-12 NOTE — Progress Notes (Addendum)
  PROGRESS NOTE    VANIA ROSERO  TGA:[id number] DOB: 1965/05/20 DOA: 09/11/2019  PCP: Patient, No Pcp Per    LOS - 0    Patient admitted overnight with jaundice, poor PO intake and generalized weakness with flu-like symptoms for about 1 week at home.    Interval subjective: patient seen with daughter at bedside, in ED holding for a bed.  She reports feeling okay.  No N/V.  No abdominal pain, fevers or chills.  Says her abdomen feels a bit less distended today.  Exam: distended but nontender abdomen, veins visible on abdominal wall, no LE edema, lungs clear and heart RRR.  Awake, A&Ox4.  I have reviewed the full H&P by Dr. Debby Bud in detail, and I agree with the assessment and plan as outlined therein. In addition: --daily prednisolone started, per GI --changed Cipro to Rocephin for UTI --hepatitis panel negative --monitor LFT's and bili, possible MRCP if elevated bili not improving --potassium replaced  No Charge    Pennie Banter, DO [facility]   If 7PM-7AM, please contact night-coverage [email or web address] 09/12/2019, 5:00 PM

## 2019-09-12 NOTE — ED Notes (Signed)
Pts daughter returned to bedside in time for admitting MD update and assessment.

## 2019-09-12 NOTE — ED Notes (Signed)
Patient ambulated to bathroom without assist.  Steady on feet.

## 2019-09-12 NOTE — ED Notes (Signed)
This RN received call from [place], able to do patient's [place] at this time, notified us pt had a drink with her PO meds administered at 0310 by night shift RN, per [place] able to do [place] at this time, will come to room to do bedside US.

## 2019-09-13 DIAGNOSIS — F411 Generalized anxiety disorder: Secondary | ICD-10-CM | POA: Diagnosis present

## 2019-09-13 DIAGNOSIS — F1020 Alcohol dependence, uncomplicated: Secondary | ICD-10-CM | POA: Diagnosis present

## 2019-09-13 DIAGNOSIS — D696 Thrombocytopenia, unspecified: Secondary | ICD-10-CM | POA: Diagnosis present

## 2019-09-13 DIAGNOSIS — E876 Hypokalemia: Secondary | ICD-10-CM | POA: Diagnosis present

## 2019-09-13 DIAGNOSIS — F10230 Alcohol dependence with withdrawal, uncomplicated: Secondary | ICD-10-CM

## 2019-09-13 LAB — URINE CULTURE: Culture: <10000 — AB

## 2019-09-13 LAB — COMPREHENSIVE METABOLIC PANEL
ALT: 44 U/L (ref 0–44)
AST: 177 U/L — ABNORMAL HIGH (ref 15–41)
Albumin: 2 g/dL — ABNORMAL LOW (ref 3.5–5.0)
Alkaline Phosphatase: 167 U/L — ABNORMAL HIGH (ref 38–126)
Anion gap: 5 (ref 5–15)
BUN: 9 mg/dL (ref 6–20)
CO2: 25 mmol/L (ref 22–32)
Calcium: 7.8 mg/dL — ABNORMAL LOW (ref 8.9–10.3)
Chloride: 106 mmol/L (ref 98–111)
Creatinine, Ser: UNDETERMINED mg/dL (ref 0.44–1.00)
Glucose, Bld: 151 mg/dL — ABNORMAL HIGH (ref 70–99)
Potassium: 4 mmol/L (ref 3.5–5.1)
Sodium: 136 mmol/L (ref 135–145)
Total Bilirubin: 24.6 mg/dL (ref 0.3–1.2)
Total Protein: 6.2 g/dL — ABNORMAL LOW (ref 6.5–8.1)

## 2019-09-13 LAB — MAGNESIUM: Magnesium: 1.7 mg/dL (ref 1.7–2.4)

## 2019-09-13 MED ORDER — VITAMIN K1 10 MG/ML IJ SOLN
10.0000 mg | Freq: Every day | INTRAMUSCULAR | Status: AC
  Administered 2019-09-13 – 2019-09-15 (×3): 10 mg via SUBCUTANEOUS
  Filled 2019-09-13 (×3): qty 1

## 2019-09-13 MED ORDER — HYDROCORTISONE (PERIANAL) 2.5 % EX CREA
TOPICAL_CREAM | Freq: Three times a day (TID) | CUTANEOUS | Status: DC
  Administered 2019-09-13: 1 via RECTAL
  Filled 2019-09-13: qty 28.35

## 2019-09-13 NOTE — Progress Notes (Signed)
PROGRESS NOTE    Marcia CokeSherry L Strickland   ZOX:[id number]  DOB: July 02, 1965  PCP: Patient, No Pcp Per    DOA: 09/11/2019 LOS: 1   Brief Narrative   Marcia CokeSherry L Strickland is a 54 y.o. female with no known past, has not seen a physician in about 10 years. She presented to the ED on 09/11/19 with progressive jaundice, poor appetite and generalized weakness.  This developed after having flu-like symptoms for a week or more.  No prior history of liver disease.  She reported drinking about 1 pint/week of vodka.  In the ED, hemodynamically stable, with jaundice and scleral icterus.  Labs notable for elevated PT, aPTT and INR, potassium 3.1, lipase 134, alkPhos 203, AST 244, ALT 51, Tbili 28.1, WBC 15.9k.  UA was positive for infection.  EtOH level 92.  CT abdomen/pelvis showed hepatic steatosis, portal hypertension with varices.  GI was consulted in the ED, recommended treating with steroids and admission.  Admitted to hospitalist service with GI following.     Assessment & Plan   Principal Problem:   Alcoholic hepatitis with ascites Active Problems:   Jaundice   Acute lower UTI   Alcohol dependence (HCC)   Hypokalemia   Thrombocytopenia (HCC)   Anxiety, generalized   Alcoholic hepatitis with ascites and jaundice - POA.  Initial LFT's showed Tbili 28.1, Alkphos 203, AST 244, ALT 51.  Lipase was 134.  Labs improving.   Discriminate function was 41.8 on admission, qualifies for steroids.  Hepatitis panel was negative.  Ultrasound showed likely cirrhotic liver with increased hepatic echogenicity and slightly nodular hepatic margins, no masses, contracted gallbladder, mild dilation of CBD to 8 mm, small volume ascites. Pancreas was not well visualized. CT findings as above. --GI following, appreciate their assitance --continue Prednisolone 40 mg daily --reassess scores on day 5 to determine whether to continue steroids --recommend complete alcohol cessation --MRCP --vitamin K given today --optimize  nutrition status --outpatient EGD to screen for varices  Acute lower UTI - ruled out.  Urine culture with insignificant growth.  Patient without urinary symptoms so will stop antibiotics.  Monitor for symptoms.   Alcohol dependence - last drink Thursday 6/3 evening.  Presented with serum EtOH 92.  Long history of self-medicating for anxiety with alcohol. --CIWA protocol with PRN Ativan --will add Librium if scores rising  Hypokalemia - POA with potassium 3.1.  Replaced.  Monitor and replace as needed.  Thrombocytopenia  - due to liver disease.  Monitor CBC.  Hypoalbuminemia - due to liver disease and maybe poor nutrition habits.  Monitor BP, edema, LFT's.    Anxiety, generalized - patient reports history of using alcohol to deal with anxiety and stress.  She reports tremors when sober that are provoked by stress and anxiety.  Has tried Lexapro in past, say just it made her sleep.  She is agreeable to trying another medication.   No PCP - TOC consulted.   Patient BMI: Body mass index is 34.75 kg/m.   DVT prophylaxis: SCD's  Diet:  Diet Orders (From admission, onward)    Start     Ordered   09/12/19 1543  Diet regular Room service appropriate? Yes; Fluid consistency: Thin  Diet effective now    Question Answer Comment  Room service appropriate? Yes   Fluid consistency: Thin      09/12/19 1542            Code Status: Full Code    Subjective 09/13/19    Patient seen at  bedside with her 2 daughters present today.  She reports feeling fairly well.  Says her abdomen feels somewhat less distended.  Denies abdominal pain, nausea or vomiting.  Reports soft stools.  No fevers or chills.  Says her last drink was on Thursday evening, just under 48 hours ago.  Reports that she has developed tremors in the past when she stops drinking, however she also has tremors at baseline when sober due to anxiety/stress.  States she started drinking because it helped with her anxious tremors.   Agrees to trying a daily medication for her anxiety.  Tried Lexapro in the past but said it just made her sleep.  No other acute complaints.   Disposition Plan & Communication   Status is: Inpatient  Remains inpatient appropriate because:Ongoing diagnostic testing needed not appropriate for outpatient work up   Dispo:  Patient From: Home  Planned Disposition: Home  Expected discharge date: 09/15/19  Medically stable for discharge: No   Family Communication: two daughters at bedside during encounter    Consults, Procedures, Significant Events   Consultants:   Gastroenterology  Procedures:   None  Antimicrobials:   Rocephin 6/4 >> 6/5 (urine culture with insignificant growth)   Objective   Vitals:   09/12/19 1515 09/12/19 2149 09/12/19 2200 09/13/19 0752  BP: 137/82 138/85  138/84  Pulse: 98 93  90  Resp: 17  18 18   Temp: 99.2 F (37.3 C)  98.5 F (36.9 C) 98.2 F (36.8 C)  TempSrc: Oral  Oral Oral  SpO2: 96% 95%  97%  Weight:      Height:        Intake/Output Summary (Last 24 hours) at 09/13/2019 1343 Last data filed at 09/13/2019 1039 Gross per 24 hour  Intake 851.6 ml  Output --  Net 851.6 ml   Filed Weights   09/11/19 1959  Weight: 86.2 kg    Physical Exam:  General exam: awake, alert, no acute distress HEENT: icteric sclera, moist mucus membranes, hearing grossly normal  Respiratory system: CTAB, no wheezes, rales or rhonchi, normal respiratory effort. Cardiovascular system: normal S1/S2, RRR, no pedal edema.   Gastrointestinal system: Somewhat distended, nontender, +bowel sounds, no appreciable fluid wave Central nervous system: A&O x4. no gross focal neurologic deficits, normal speech Extremities: moves all, normal tone Skin: jaundice, dry, intact, normal temperature Psychiatry: normal mood, congruent affect, judgement and insight appear normal  Labs   Data Reviewed: I have personally reviewed following labs and imaging  studies  CBC: Recent Labs  Lab 09/11/19 2004 09/12/19 0538 09/12/19 1917  WBC 15.9* 11.0* 12.7*  NEUTROABS 10.7*  --  9.7*  HGB 13.4 12.3 11.8*  HCT 36.0 33.3* 31.8*  MCV 95.2 95.7 94.9  PLT 138* 129* 131*   Basic Metabolic Panel: Recent Labs  Lab 09/11/19 2004 09/12/19 0538 09/13/19 0412  NA 128* 131* 136  K 3.1* 3.3* 4.0  CL 92* 97* 106  CO2 23 24 25   GLUCOSE 103* 127* 151*  BUN 10 8 9   CREATININE UNABLE TO RESULT DUE TO ICTERUS UNABLE TO REPORT DUE TO ICTERUS UNABLE TO RESULT DUE TO ICTERUS  CALCIUM 8.1* 7.7* 7.8*  MG  --  1.7 1.7   GFR: CrCl cannot be calculated (This lab value cannot be used to calculate CrCl because it is not a number: UNABLE TO RESULT DUE TO ICTERUS). Liver Function Tests: Recent Labs  Lab 09/11/19 2004 09/12/19 0538 09/13/19 0412  AST 244* 212* 177*  ALT 51* 49* 44  ALKPHOS 203* 166* 167*  BILITOT 28.1* 26.4* 24.6*  PROT 7.4 6.6 6.2*  ALBUMIN 2.7* 2.3* 2.0*   Recent Labs  Lab 09/11/19 2004  LIPASE 134*   No results for input(s): AMMONIA in the last 168 hours. Coagulation Profile: Recent Labs  Lab 09/11/19 2231  INR 1.6*   Cardiac Enzymes: No results for input(s): CKTOTAL, CKMB, CKMBINDEX, TROPONINI in the last 168 hours. BNP (last 3 results) No results for input(s): PROBNP in the last 8760 hours. HbA1C: No results for input(s): HGBA1C in the last 72 hours. CBG: No results for input(s): GLUCAP in the last 168 hours. Lipid Profile: No results for input(s): CHOL, HDL, LDLCALC, TRIG, CHOLHDL, LDLDIRECT in the last 72 hours. Thyroid Function Tests: No results for input(s): TSH, T4TOTAL, FREET4, T3FREE, THYROIDAB in the last 72 hours. Anemia Panel: No results for input(s): VITAMINB12, FOLATE, FERRITIN, TIBC, IRON, RETICCTPCT in the last 72 hours. Sepsis Labs: No results for input(s): PROCALCITON, LATICACIDVEN in the last 168 hours.  Recent Results (from the past 240 hour(s))  Urine Culture     Status: Abnormal    Collection Time: 09/11/19  8:04 PM   Specimen: Urine, Clean Catch  Result Value Ref Range Status   Specimen Description   Final    URINE, CLEAN CATCH Performed at [facility], [address], [place], [place] [address]    Special Requests   Final    NONE Performed at [facility], [address], [place], [place] [address]    Culture (A)  Final    <10,000 COLONIES/mL INSIGNIFICANT GROWTH Performed at [facility], [address], [place], [place] [address]    Report Status 09/13/2019 FINAL  Final  SARS Coronavirus 2 by RT PCR (hospital order, performed in [facility] hospital lab) Nasopharyngeal Nasopharyngeal Swab     Status: None   Collection Time: 09/11/19 11:38 PM   Specimen: Nasopharyngeal Swab  Result Value Ref Range Status   SARS Coronavirus 2 NEGATIVE NEGATIVE Final    Comment: (NOTE) SARS-CoV-2 target nucleic acids are NOT DETECTED. The SARS-CoV-2 RNA is generally detectable in upper and lower respiratory specimens during the acute phase of infection. The lowest concentration of SARS-CoV-2 viral copies this assay can detect is 250 copies / mL. A negative result does not preclude SARS-CoV-2 infection and should not be used as the sole basis for treatment or other patient management decisions.  A negative result may occur with improper specimen collection / handling, submission of specimen other than nasopharyngeal swab, presence of viral mutation(s) within the areas targeted by this assay, and inadequate number of viral copies (<250 copies / mL). A negative result must be combined with clinical observations, patient history, and epidemiological information. Fact Sheet for Patients:   BoilerBrush.com.cy Fact Sheet for Healthcare Providers: https://pope.com/ This test is not yet approved or cleared  by the [place] FDA and has been authorized for detection and/or diagnosis of SARS-CoV-2 by FDA  under an Emergency Use Authorization (EUA).  This EUA will remain in effect (meaning this test can be used) for the duration of the COVID-19 declaration under Section 564(b)(1) of the Act, 21 U.S.C. section 360bbb-3(b)(1), unless the authorization is terminated or revoked sooner. Performed at [facility], [address] [place], [place] [address]       Imaging Studies   CT ABDOMEN PELVIS WO CONTRAST  Result Date: 09/11/2019 CLINICAL DATA:  Jaundice for 2 weeks, alcohol abuse EXAM: CT ABDOMEN AND PELVIS WITHOUT CONTRAST TECHNIQUE: Multidetector  CT imaging of the abdomen and pelvis was performed following the standard protocol without IV contrast. Unenhanced CT was performed per clinician order. Lack of IV contrast limits sensitivity and specificity, especially for evaluation of abdominal/pelvic solid viscera. COMPARISON:  None. FINDINGS: Lower chest: No acute pleural or parenchymal lung disease. Hepatobiliary: Heterogeneous decreased attenuation consistent with background hepatic steatosis. There are likely scattered areas of fatty sparing. If focal liver parenchymal abnormality is suspected or report to document, dedicated liver MRI or CT may be useful. Gallbladder is decompressed. Pancreas: Unremarkable. No pancreatic ductal dilatation or surrounding inflammatory changes. Spleen: Normal in size without focal abnormality. Adrenals/Urinary Tract: No urinary tract calculi or obstructive uropathy. The adrenals are normal. The bladder is unremarkable. Stomach/Bowel: No bowel obstruction or ileus. Normal gas-filled appendix right lower quadrant. No bowel wall thickening or inflammatory change. Vascular/Lymphatic: Evaluation of the vasculature is limited without IV contrast. Numerous varices are seen within the left upper quadrant. There is apparent recanalization of the umbilical vein. Minimal atherosclerosis of the aorta.  No pathologic adenopathy. Reproductive: Uterus and bilateral adnexa are  unremarkable. Other: Small volume ascites throughout the abdomen and pelvis. No free intra-abdominal gas. Fat containing supraumbilical and umbilical hernias are noted. No bowel herniation. Musculoskeletal: No acute or destructive bony lesions. Reconstructed images demonstrate no additional findings. IMPRESSION: 1. Heterogeneous decreased attenuation of the liver, consistent with background hepatic steatosis. If focal liver parenchymal abnormality is suspected or clinically important to document, follow-up nonemergent dedicated liver MRI or CT could be considered. 2. Evidence of portal venous hypertension with left upper quadrant varices and recanalization of the umbilical vein. 3. Small volume ascites. 4. Fat containing umbilical and midline ventral hernias. No bowel herniation. Electronically Signed   By: Randa Ngo M.D.   On: 09/11/2019 22:49   US ABDOMEN COMPLETE W/ELASTOGRAPHY  Result Date: 09/12/2019 CLINICAL DATA:  Hepatitis, jaundice, abdominal distension EXAM: ULTRASOUND ABDOMEN ULTRASOUND HEPATIC ELASTOGRAPHY TECHNIQUE: Sonography of the upper abdomen was performed. In addition, ultrasound elastography evaluation of the liver was performed. A region of interest was placed within the right lobe of the liver. Following application of a compressive sonographic pulse, tissue compressibility was assessed. Multiple assessments were performed at the selected site. Median tissue compressibility was determined. Previously, hepatic stiffness was assessed by shear wave velocity. Based on recently published Society of Radiologists in Ultrasound consensus article, reporting is now recommended to be performed in the SI units of pressure (kiloPascals) representing hepatic stiffness/elasticity. The obtained result is compared to the published reference standards. (cACLD = compensated Advanced Chronic Liver Disease) COMPARISON:  CT abdomen pelvis 09/11/2019 FINDINGS: ULTRASOUND ABDOMEN Gallbladder: Contracted with  thickened wall. Small amount of pericholecystic fluid as well as ascites. Unable to exclude calculi due to collapsed state. No sonographic Murphy sign. Common bile duct: Diameter: 8 mm, dilated for age Liver: Coarsened increased echogenicity of liver with slightly nodular hepatic margins suggesting cirrhosis. No discrete hepatic mass. Portal vein is patent on color Doppler imaging with normal direction of blood flow towards the liver. IVC: Short segment visualized, patent on color Doppler imaging Pancreas: Visualized portion of body and tail normal appearance with remainder obscured by bowel gas Spleen: Normal appearance, 9.6 cm length Right Kidney: Length: 14.3 cm. Normal morphology without mass or hydronephrosis. Left Kidney: Length: 12.4 cm. Normal morphology without mass or hydronephrosis. Abdominal aorta: Normal caliber proximally, obscured at mid to distal portions Other findings: Mild perihepatic ascites. ULTRASOUND HEPATIC ELASTOGRAPHY Device: Siemens Helix VTQ Patient position: Supine Transducer 5C1 Number of measurements: 10  Hepatic segment:  8 Median kPa: 20.5 IQR: 2.4 IQR/Median kPa ratio: 0.1 Data quality:  Good, see discussion below in impression Diagnostic category:  See discussion below in impression IMPRESSION: ULTRASOUND ABDOMEN: Likely cirrhotic liver with increased hepatic echogenicity and slightly nodular hepatic margins. No focal hepatic mass identified. Contracted gallbladder, inadequately assessed. Mildly dilated CBD 8 mm diameter, recommend correlation with LFTs and potentially MRCP imaging if there is clinical suspicion of distal biliary obstruction. Small volume ascites. Incomplete pancreatic visualization. ULTRASOUND HEPATIC ELASTOGRAPHY: Median kPa:  20.5 Diagnostic category: > or =17 kPa: highly suggestive of cACLD with an increased probability of clinically significant portal hypertension Patient has history of hepatitis, uncertain if this is acute or chronic. In addition, patient is  jaundiced and has a mildly dilated CBD. Hepatic stiffness can increase from multiple etiologies other than fibrosis, including acute inflammation/hepatitis and cholestasis. Therefore the significance of the obtained kPa = 20.5 is uncertain, unknown if related to fibrosis, hepatitis or cholestasis. Recommend follow-up elastography be performed when patient is asymptomatic, at baseline, to reassess and exclude non fibrotic etiologies. The use of hepatic elastography is applicable to patients with viral hepatitis and non-alcoholic fatty liver disease. At this time, there is insufficient data for the referenced cut-off values and use in other causes of liver disease, including alcoholic liver disease. Patients, however, may be assessed by elastography and serve as their own reference standard/baseline. In patients with non-alcoholic liver disease, the values suggesting compensated advanced chronic liver disease (cACLD) may be lower, and patients may need additional testing with elasticity results of 7-9 kPa. Please note that abnormal hepatic elasticity and shear wave velocities may also be identified in clinical settings other than with hepatic fibrosis, such as: acute hepatitis, elevated right heart and central venous pressures including use of beta blockers, veno-occlusive disease (Budd-Chiari), infiltrative processes such as mastocytosis/amyloidosis/infiltrative tumor/lymphoma, extrahepatic cholestasis, with hyperemia in the post-prandial state, and with liver transplantation. Correlation with patient history, laboratory data, and clinical condition recommended. Diagnostic Categories: < or =5 kPa: high probability of being normal < or =9 kPa: in the absence of other known clinical signs, rules out cACLD >9 kPa and ?13 kPa: suggestive of cACLD, but needs further testing >13 kPa: highly suggestive of cACLD > or =17 kPa: highly suggestive of cACLD with an increased probability of clinically significant portal  hypertension Electronically Signed   By: Ulyses Southward M.D.   On: 09/12/2019 12:57     Medications   Scheduled Meds: . folic acid  1 mg Oral Daily  . hydrocortisone   Rectal TID  . LORazepam  0-4 mg Oral Q6H   Followed by  . [START ON 09/14/2019] LORazepam  0-4 mg Oral Q12H  . multivitamin with minerals  1 tablet Oral Daily  . phytonadione  10 mg Subcutaneous Daily  . prednisoLONE  40 mg Oral Daily  . senna  1 tablet Oral BID  . thiamine  100 mg Oral Daily   Continuous Infusions:      LOS: 1 day    Time spent: 30 minutes    Pennie Banter, DO [facility]  09/13/2019, 1:43 PM    If 7PM-7AM, please contact night-coverage. How to contact the [facility] Attending or Consulting provider 7A - 7P or covering provider during after hours 7P -7A, for this patient?    1. Check the care team in [facility] and look for a) attending/consulting [facility] provider listed and b) the [facility] team listed 2. Log into [email or web address] and use [place]'s universal  password to access. If you do not have the password, please contact the hospital operator. 3. Locate the [facility] provider you are looking for under [facility] and page to a number that you can be directly reached. 4. If you still have difficulty reaching the provider, please page the [facility] (Director on Call) for the Hospitalists listed on amion for assistance.

## 2019-09-13 NOTE — Progress Notes (Signed)
[name] , MD [address], [place], [place], [address] [address], [place], [place], [address] Phone: [phone]  Fax: [phone]   Marcia Strickland is being followed for alcoholic hepatitis  Day 1 of follow up   Subjective: Eating and drinking , feels well    Objective: Vital signs in last 24 hours: Vitals:   09/12/19 1515 09/12/19 2149 09/12/19 2200 09/13/19 0752  BP: 137/82 138/85  138/84  Pulse: 98 93  90  Resp: 17  18 18   Temp: 99.2 F (37.3 C)  98.5 F (36.9 C) 98.2 F (36.8 C)  TempSrc: Oral  Oral Oral  SpO2: 96% 95%  97%  Weight:      Height:       Weight change:   Intake/Output Summary (Last 24 hours) at 09/13/2019 11/13/2019 Last data filed at 09/13/2019 0825 Gross per 24 hour  Intake 1323.7 ml  Output --  Net 1323.7 ml     Exam: Heart:: Regular rate and rhythm, S1S2 present or without murmur or extra heart sounds Lungs: normal, clear to auscultation and clear to auscultation and percussion Abdomen: soft, nontender, normal bowel sounds   Lab Results: @LABTEST2 @ Micro Results: Recent Results (from the past 240 hour(s))  SARS Coronavirus 2 by RT PCR (hospital order, performed in [facility] hospital lab) Nasopharyngeal Nasopharyngeal Swab     Status: None   Collection Time: 09/11/19 11:38 PM   Specimen: Nasopharyngeal Swab  Result Value Ref Range Status   SARS Coronavirus 2 NEGATIVE NEGATIVE Final    Comment: (NOTE) SARS-CoV-2 target nucleic acids are NOT DETECTED. The SARS-CoV-2 RNA is generally detectable in upper and lower respiratory specimens during the acute phase of infection. The lowest concentration of SARS-CoV-2 viral copies this assay can detect is 250 copies / mL. A negative result does not preclude SARS-CoV-2 infection and should not be used as the sole basis for treatment or other patient management decisions.  A negative result may occur with improper specimen collection / handling, submission of specimen  other than nasopharyngeal swab, presence of viral mutation(s) within the areas targeted by this assay, and inadequate number of viral copies (<250 copies / mL). A negative result must be combined with clinical observations, patient history, and epidemiological information. Fact Sheet for Patients:   [facility] Fact Sheet for Healthcare Providers: 11/11/19 This test is not yet approved or cleared  by the [employer] FDA and has been authorized for detection and/or diagnosis of SARS-CoV-2 by FDA under an Emergency Use Authorization (EUA).  This EUA will remain in effect (meaning this test can be used) for the duration of the COVID-19 declaration under Section 564(b)(1) of the Act, 21 U.S.C. section 360bbb-3(b)(1), unless the authorization is terminated or revoked sooner. Performed at [facility], [address] [place], [address] [place]    Studies/Results: CT ABDOMEN PELVIS WO CONTRAST  Result Date: 09/11/2019 CLINICAL DATA:  Jaundice for 2 weeks, alcohol abuse EXAM: CT ABDOMEN AND PELVIS WITHOUT CONTRAST TECHNIQUE: Multidetector CT imaging of the abdomen and pelvis was performed following the standard protocol without IV contrast. Unenhanced CT was performed per clinician order. Lack of IV contrast limits sensitivity and specificity, especially for evaluation of abdominal/pelvic solid viscera. COMPARISON:  None. FINDINGS: Lower chest: No acute pleural or parenchymal lung disease. Hepatobiliary: Heterogeneous decreased attenuation consistent with background hepatic steatosis. There are likely scattered areas of fatty sparing. If focal liver parenchymal abnormality is suspected or report to document, dedicated liver MRI or  CT may be useful. Gallbladder is decompressed. Pancreas: Unremarkable. No pancreatic ductal dilatation or surrounding inflammatory changes. Spleen: Normal in size without focal abnormality.  Adrenals/Urinary Tract: No urinary tract calculi or obstructive uropathy. The adrenals are normal. The bladder is unremarkable. Stomach/Bowel: No bowel obstruction or ileus. Normal gas-filled appendix right lower quadrant. No bowel wall thickening or inflammatory change. Vascular/Lymphatic: Evaluation of the vasculature is limited without IV contrast. Numerous varices are seen within the left upper quadrant. There is apparent recanalization of the umbilical vein. Minimal atherosclerosis of the aorta.  No pathologic adenopathy. Reproductive: Uterus and bilateral adnexa are unremarkable. Other: Small volume ascites throughout the abdomen and pelvis. No free intra-abdominal gas. Fat containing supraumbilical and umbilical hernias are noted. No bowel herniation. Musculoskeletal: No acute or destructive bony lesions. Reconstructed images demonstrate no additional findings. IMPRESSION: 1. Heterogeneous decreased attenuation of the liver, consistent with background hepatic steatosis. If focal liver parenchymal abnormality is suspected or clinically important to document, follow-up nonemergent dedicated liver MRI or CT could be considered. 2. Evidence of portal venous hypertension with left upper quadrant varices and recanalization of the umbilical vein. 3. Small volume ascites. 4. Fat containing umbilical and midline ventral hernias. No bowel herniation. Electronically Signed   By: Sharlet Salina M.D.   On: 09/11/2019 22:49   US ABDOMEN COMPLETE W/ELASTOGRAPHY  Result Date: 09/12/2019 CLINICAL DATA:  Hepatitis, jaundice, abdominal distension EXAM: ULTRASOUND ABDOMEN ULTRASOUND HEPATIC ELASTOGRAPHY TECHNIQUE: Sonography of the upper abdomen was performed. In addition, ultrasound elastography evaluation of the liver was performed. A region of interest was placed within the right lobe of the liver. Following application of a compressive sonographic pulse, tissue compressibility was assessed. Multiple assessments were  performed at the selected site. Median tissue compressibility was determined. Previously, hepatic stiffness was assessed by shear wave velocity. Based on recently published Society of Radiologists in Ultrasound consensus article, reporting is now recommended to be performed in the SI units of pressure (kiloPascals) representing hepatic stiffness/elasticity. The obtained result is compared to the published reference standards. (cACLD = compensated Advanced Chronic Liver Disease) COMPARISON:  CT abdomen pelvis 09/11/2019 FINDINGS: ULTRASOUND ABDOMEN Gallbladder: Contracted with thickened wall. Small amount of pericholecystic fluid as well as ascites. Unable to exclude calculi due to collapsed state. No sonographic Murphy sign. Common bile duct: Diameter: 8 mm, dilated for age Liver: Coarsened increased echogenicity of liver with slightly nodular hepatic margins suggesting cirrhosis. No discrete hepatic mass. Portal vein is patent on color Doppler imaging with normal direction of blood flow towards the liver. IVC: Short segment visualized, patent on color Doppler imaging Pancreas: Visualized portion of body and tail normal appearance with remainder obscured by bowel gas Spleen: Normal appearance, 9.6 cm length Right Kidney: Length: 14.3 cm. Normal morphology without mass or hydronephrosis. Left Kidney: Length: 12.4 cm. Normal morphology without mass or hydronephrosis. Abdominal aorta: Normal caliber proximally, obscured at mid to distal portions Other findings: Mild perihepatic ascites. ULTRASOUND HEPATIC ELASTOGRAPHY Device: Siemens Helix VTQ Patient position: Supine Transducer 5C1 Number of measurements: 10 Hepatic segment:  8 Median kPa: 20.5 IQR: 2.4 IQR/Median kPa ratio: 0.1 Data quality:  Good, see discussion below in impression Diagnostic category:  See discussion below in impression IMPRESSION: ULTRASOUND ABDOMEN: Likely cirrhotic liver with increased hepatic echogenicity and slightly nodular hepatic margins.  No focal hepatic mass identified. Contracted gallbladder, inadequately assessed. Mildly dilated CBD 8 mm diameter, recommend correlation with LFTs and potentially MRCP imaging if there is clinical suspicion of distal biliary obstruction. Small volume ascites. Incomplete pancreatic  visualization. ULTRASOUND HEPATIC ELASTOGRAPHY: Median kPa:  20.5 Diagnostic category: > or =17 kPa: highly suggestive of cACLD with an increased probability of clinically significant portal hypertension Patient has history of hepatitis, uncertain if this is acute or chronic. In addition, patient is jaundiced and has a mildly dilated CBD. Hepatic stiffness can increase from multiple etiologies other than fibrosis, including acute inflammation/hepatitis and cholestasis. Therefore the significance of the obtained kPa = 15.4 is uncertain, unknown if related to fibrosis, hepatitis or cholestasis. Recommend follow-up elastography be performed when patient is asymptomatic, at baseline, to reassess and exclude non fibrotic etiologies. The use of hepatic elastography is applicable to patients with viral hepatitis and non-alcoholic fatty liver disease. At this time, there is insufficient data for the referenced cut-off values and use in other causes of liver disease, including alcoholic liver disease. Patients, however, may be assessed by elastography and serve as their own reference standard/baseline. In patients with non-alcoholic liver disease, the values suggesting compensated advanced chronic liver disease (cACLD) may be lower, and patients may need additional testing with elasticity results of 7-9 kPa. Please note that abnormal hepatic elasticity and shear wave velocities may also be identified in clinical settings other than with hepatic fibrosis, such as: acute hepatitis, elevated right heart and central venous pressures including use of beta blockers, veno-occlusive disease (Budd-Chiari), infiltrative processes such as  mastocytosis/amyloidosis/infiltrative tumor/lymphoma, extrahepatic cholestasis, with hyperemia in the post-prandial state, and with liver transplantation. Correlation with patient history, laboratory data, and clinical condition recommended. Diagnostic Categories: < or =5 kPa: high probability of being normal < or =9 kPa: in the absence of other known clinical signs, rules out cACLD >9 kPa and ?13 kPa: suggestive of cACLD, but needs further testing >13 kPa: highly suggestive of cACLD > or =17 kPa: highly suggestive of cACLD with an increased probability of clinically significant portal hypertension Electronically Signed   By: Lavonia Dana M.D.   On: 09/12/2019 12:57   Medications: I have reviewed the patient's current medications. Scheduled Meds: . folic acid  1 mg Oral Daily  . LORazepam  0-4 mg Oral Q6H   Followed by  . [START ON 09/14/2019] LORazepam  0-4 mg Oral Q12H  . multivitamin with minerals  1 tablet Oral Daily  . prednisoLONE  40 mg Oral Daily  . senna  1 tablet Oral BID  . thiamine  100 mg Oral Daily   Continuous Infusions: . cefTRIAXone (ROCEPHIN)  IV Stopped (09/12/19 1018)   PRN Meds:.LORazepam **OR** LORazepam, ondansetron (ZOFRAN) IV, traMADol   Assessment: Principal Problem:   Alcoholic hepatitis with ascites Active Problems:   Jaundice   Acute lower UTI   Hypokalemia   Thrombocytopenia (HCC)   Alcohol dependence (HCC)   Marcia Strickland 54 y.o.  Admitted with alcoholic hepatitis . DF 41.8. Acute hepattiis panel negative. Tbil 24.6. INR 1.6 . USG shows CBD 8 mm   Plan: 1. Continue Prednisolone - check lillie score at day 5 to decide whether to continue steroids or stop  2. Good nutrition   3. Stop all alcohol intake  4. MRCP for dilated CBD as recommended by Dr Bonna Gains yesterday   5. Vitamin K for any malnutrition related vitamin K deficiencies- order placed.   6. Outpatient EGD to screen for varices   LOS: 1 day   Jonathon Bellows, MD 09/13/2019, 9:21 AM

## 2019-09-13 NOTE — Hospital Course (Signed)
Marcia Strickland is a 54 y.o. female with no known past, has not seen a physician in about 10 years. She presented to the ED on 09/11/19 with progressive jaundice, poor appetite and generalized weakness.  This developed after having flu-like symptoms for a week or more.  No prior history of liver disease.  She reported drinking about 1 pint/week of vodka.  In the ED, hemodynamically stable, with jaundice and scleral icterus.  Labs notable for elevated PT, aPTT and INR, potassium 3.1, lipase 134, alkPhos 203, AST 244, ALT 51, Tbili 28.1, WBC 15.9k.  UA was positive for infection.  EtOH level 92.  CT abdomen/pelvis showed hepatic steatosis, portal hypertension with varices.  GI was consulted in the ED, recommended treating with steroids and admission.  Admitted to hospitalist service with GI following.

## 2019-09-14 DIAGNOSIS — F411 Generalized anxiety disorder: Secondary | ICD-10-CM

## 2019-09-14 DIAGNOSIS — K7010 Alcoholic hepatitis without ascites: Secondary | ICD-10-CM

## 2019-09-14 LAB — CBC WITH DIFFERENTIAL/PLATELET
Abs Immature Granulocytes: 0.46 10*3/uL — ABNORMAL HIGH (ref 0.00–0.07)
Basophils Absolute: 0.1 10*3/uL (ref 0.0–0.1)
Basophils Relative: 1 %
Eosinophils Absolute: 0 10*3/uL (ref 0.0–0.5)
Eosinophils Relative: 0 %
HCT: 37.9 % (ref 36.0–46.0)
Hemoglobin: 13.8 g/dL (ref 12.0–15.0)
Immature Granulocytes: 3 %
Lymphocytes Relative: 12 %
Lymphs Abs: 2.1 10*3/uL (ref 0.7–4.0)
MCH: 35.3 pg — ABNORMAL HIGH (ref 26.0–34.0)
MCHC: 36.4 g/dL — ABNORMAL HIGH (ref 30.0–36.0)
MCV: 96.9 fL (ref 80.0–100.0)
Monocytes Absolute: 2 10*3/uL — ABNORMAL HIGH (ref 0.1–1.0)
Monocytes Relative: 11 %
Neutro Abs: 13.2 10*3/uL — ABNORMAL HIGH (ref 1.7–7.7)
Neutrophils Relative %: 73 %
Platelets: 173 10*3/uL (ref 150–400)
RBC: 3.91 MIL/uL (ref 3.87–5.11)
RDW: 19.7 % — ABNORMAL HIGH (ref 11.5–15.5)
WBC: 17.9 10*3/uL — ABNORMAL HIGH (ref 4.0–10.5)
nRBC: 0.1 % (ref 0.0–0.2)

## 2019-09-14 LAB — COMPREHENSIVE METABOLIC PANEL
ALT: 58 U/L — ABNORMAL HIGH (ref 0–44)
AST: 196 U/L — ABNORMAL HIGH (ref 15–41)
Albumin: 2.4 g/dL — ABNORMAL LOW (ref 3.5–5.0)
Alkaline Phosphatase: 186 U/L — ABNORMAL HIGH (ref 38–126)
Anion gap: 8 (ref 5–15)
BUN: 10 mg/dL (ref 6–20)
CO2: 24 mmol/L (ref 22–32)
Calcium: 8.1 mg/dL — ABNORMAL LOW (ref 8.9–10.3)
Chloride: 104 mmol/L (ref 98–111)
Creatinine, Ser: UNDETERMINED mg/dL (ref 0.44–1.00)
Glucose, Bld: 96 mg/dL (ref 70–99)
Potassium: 3.8 mmol/L (ref 3.5–5.1)
Sodium: 136 mmol/L (ref 135–145)
Total Bilirubin: 27.9 mg/dL (ref 0.3–1.2)
Total Protein: 6.9 g/dL (ref 6.5–8.1)

## 2019-09-14 LAB — PROTIME-INR
INR: 1.7 — ABNORMAL HIGH (ref 0.8–1.2)
Prothrombin Time: 19.2 seconds — ABNORMAL HIGH (ref 11.4–15.2)

## 2019-09-14 LAB — LIPASE, BLOOD: Lipase: 132 U/L — ABNORMAL HIGH (ref 11–51)

## 2019-09-14 MED ORDER — NICOTINE 21 MG/24HR TD PT24
21.0000 mg | MEDICATED_PATCH | Freq: Every day | TRANSDERMAL | Status: DC
  Administered 2019-09-14 – 2019-09-16 (×3): 21 mg via TRANSDERMAL
  Filled 2019-09-14 (×4): qty 1

## 2019-09-14 MED ORDER — RIFAXIMIN 550 MG PO TABS
550.0000 mg | ORAL_TABLET | Freq: Two times a day (BID) | ORAL | Status: DC
  Administered 2019-09-14 – 2019-09-16 (×5): 550 mg via ORAL
  Filled 2019-09-14 (×5): qty 1

## 2019-09-14 NOTE — Plan of Care (Signed)
°  Problem: Education: Goal: Knowledge of General Education information will improve Description: Including pain rating scale, medication(s)/side effects and non-pharmacologic comfort measures 09/14/2019 2105 by Samella Parr, RN Outcome: Progressing 09/14/2019 2105 by Samella Parr, RN Outcome: Progressing   Problem: Health Behavior/Discharge Planning: Goal: Ability to manage health-related needs will improve 09/14/2019 2105 by Samella Parr, RN Outcome: Progressing 09/14/2019 2105 by Samella Parr, RN Outcome: Progressing

## 2019-09-14 NOTE — Progress Notes (Signed)
Jonathon Bellows , MD [address] [place], [place], [place], [address] [address], [place], [place], [place], [address] Phone: [phone]  Fax: [phone]   Marcia Strickland is being followed for alcoholic hepatitis  Day 2 of follow up   Subjective: Eating and drinking , feels well, reports having bowel movements today.  Reports making good urine.  Denies abdominal pain, fever, chills nausea, vomiting.  She does report swelling of legs.  Patient's husband and daughter bedside   Objective: Vital signs in last 24 hours: Vitals:   09/13/19 0752 09/13/19 1637 09/13/19 2329 09/14/19 0744  BP: 138/84 139/85 (!) 153/87 132/87  Pulse: 90 89 89 80  Resp: 18 17 19 16   Temp: 98.2 F (36.8 C) 98.3 F (36.8 C) 98.4 F (36.9 C) 98.2 F (36.8 C)  TempSrc: Oral  Oral Oral  SpO2: 97% 97% 96% 98%  Weight:      Height:       Weight change:  No intake or output data in the 24 hours ending 09/14/19 1446   Exam: Heart:: Regular rate and rhythm, S1S2 present or without murmur or extra heart sounds Lungs: normal, clear to auscultation and clear to auscultation and percussion Abdomen: soft, nontender, normal bowel sounds   Lab Results: CBC Latest Ref Rng & Units 09/14/2019 09/12/2019 09/12/2019  WBC 4.0 - 10.5 K/uL 17.9(H) 12.7(H) 11.0(H)  Hemoglobin 12.0 - 15.0 g/dL 13.8 11.8(L) 12.3  Hematocrit 36.0 - 46.0 % 37.9 31.8(L) 33.3(L)  Platelets 150 - 400 K/uL 173 131(L) 129(L)   CMP Latest Ref Rng & Units 09/14/2019 09/13/2019 09/12/2019  Glucose 70 - 99 mg/dL 96 151(H) 127(H)  BUN 6 - 20 mg/dL 10 9 8   Creatinine 0.44 - 1.00 mg/dL UNABLE TO REPORT DUE TO ICTERUS UNABLE TO RESULT DUE TO ICTERUS UNABLE TO REPORT DUE TO ICTERUS  Sodium 135 - 145 mmol/L 136 136 131(L)  Potassium 3.5 - 5.1 mmol/L 3.8 4.0 3.3(L)  Chloride 98 - 111 mmol/L 104 106 97(L)  CO2 22 - 32 mmol/L 24 25 24   Calcium 8.9 - 10.3 mg/dL 8.1(L) 7.8(L) 7.7(L)  Total Protein 6.5 - 8.1 g/dL 6.9 6.2(L) 6.6  Total Bilirubin 0.3 - 1.2  mg/dL 27.9(HH) 24.6(HH) 26.4(HH)  Alkaline Phos 38 - 126 U/L 186(H) 167(H) 166(H)  AST 15 - 41 U/L 196(H) 177(H) 212(H)  ALT 0 - 44 U/L 58(H) 44 49(H)    Micro Results: Recent Results (from the past 240 hour(s))  Urine Culture     Status: Abnormal   Collection Time: 09/11/19  8:04 PM   Specimen: Urine, Clean Catch  Result Value Ref Range Status   Specimen Description   Final    URINE, CLEAN CATCH Performed at [facility], [address] [place], [place] [address]    Special Requests   Final    NONE Performed at [facility], [address] [place], [place] [address]    Culture (A)  Final    <10,000 COLONIES/mL INSIGNIFICANT GROWTH Performed at [facility], [place] [address] [place], [place] [address]    Report Status 09/13/2019 FINAL  Final  SARS Coronavirus 2 by RT PCR (hospital order, performed in [facility] hospital lab) Nasopharyngeal Nasopharyngeal Swab     Status: None   Collection Time: 09/11/19 11:38 PM   Specimen: Nasopharyngeal Swab  Result Value Ref Range Status   SARS Coronavirus 2 NEGATIVE NEGATIVE Final    Comment: (NOTE) SARS-CoV-2 target nucleic acids are NOT DETECTED.  The SARS-CoV-2 RNA is generally detectable in upper and lower respiratory specimens during the acute phase of infection. The lowest concentration of SARS-CoV-2 viral copies this assay can detect is 250 copies / mL. A negative result does not preclude SARS-CoV-2 infection and should not be used as the sole basis for treatment or other patient management decisions.  A negative result may occur with improper specimen collection / handling, submission of specimen other than nasopharyngeal swab, presence of viral mutation(s) within the areas targeted by this assay, and inadequate number of viral copies (<250 copies / mL). A negative result must be combined with clinical observations, patient history, and epidemiological information. Fact Sheet for Patients:     [employer] Fact Sheet for Healthcare Providers: https://pope.com/ This test is not yet approved or cleared  by the [place] FDA and has been authorized for detection and/or diagnosis of SARS-CoV-2 by FDA under an Emergency Use Authorization (EUA).  This EUA will remain in effect (meaning this test can be used) for the duration of the COVID-19 declaration under Section 564(b)(1) of the Act, 21 U.S.C. section 360bbb-3(b)(1), unless the authorization is terminated or revoked sooner. Performed at [facility], [address] [place], [place] [address]    Studies/Results: No results found. Medications: I have reviewed the patient's current medications. Scheduled Meds: . folic acid  1 mg Oral Daily  . hydrocortisone   Rectal TID  . LORazepam  0-4 mg Oral Q12H  . multivitamin with minerals  1 tablet Oral Daily  . nicotine  21 mg Transdermal Daily  . phytonadione  10 mg Subcutaneous Daily  . prednisoLONE  40 mg Oral Daily  . rifaximin  550 mg Oral BID  . senna  1 tablet Oral BID  . thiamine  100 mg Oral Daily   Continuous Infusions:  PRN Meds:.LORazepam **OR** LORazepam, ondansetron (ZOFRAN) IV, traMADol   Assessment: Principal Problem:   Alcoholic hepatitis with ascites Active Problems:   Jaundice   Acute lower UTI   Hypokalemia   Thrombocytopenia (HCC)   Alcohol dependence (HCC)   Anxiety, generalized   Marcia Strickland 54 y.o.  Admitted with alcoholic hepatitis . DF 41.8. Acute hepattiis panel negative. Tbil 24.6. INR 1.6 . USG shows CBD 8 mm  Patient does have cirrhosis of liver  Plan: 1. Continue Prednisolone -started on 6/5  check lillie score at day 5 to decide whether to continue steroids or stop  2. Good nutrition, high-protein diet  3. Stop all alcohol intake  4. MRCP for dilated CBD as recommended by Dr Maximino Greenland yesterday   5. Vitamin K for any malnutrition related vitamin K  deficiencies- order placed.   6. Outpatient EGD to screen for varices  7.  Start rifaximin 550 mg twice daily  8.  Low-sodium diet   LOS: 2 days   Lannette Donath, MD 09/14/2019, 2:46 PM

## 2019-09-14 NOTE — Plan of Care (Signed)

## 2019-09-14 NOTE — Progress Notes (Signed)
PROGRESS NOTE    Marcia Strickland   QIH:[id number]  DOB: 11/09/1965  PCP: Patient, No Pcp Per    DOA: 09/11/2019 LOS: 2   Brief Narrative   Marcia Strickland is a 54 y.o. female with no known past, has not seen a physician in about 10 years. She presented to the ED on 09/11/19 with progressive jaundice, poor appetite and generalized weakness.  This developed after having flu-like symptoms for a week or more.  No prior history of liver disease.  She reported drinking about 1 pint/week of vodka.  In the ED, hemodynamically stable, with jaundice and scleral icterus.  Labs notable for elevated PT, aPTT and INR, potassium 3.1, lipase 134, alkPhos 203, AST 244, ALT 51, Tbili 28.1, WBC 15.9k.  UA was positive for infection.  EtOH level 92.  CT abdomen/pelvis showed hepatic steatosis, portal hypertension with varices.  GI was consulted in the ED, recommended treating with steroids and admission.  Admitted to hospitalist service with GI following.     Assessment & Plan   Principal Problem:   Alcoholic hepatitis with ascites Active Problems:   Jaundice   Acute lower UTI   Alcohol dependence (HCC)   Hypokalemia   Thrombocytopenia (HCC)   Anxiety, generalized   Alcoholic hepatitis with ascites and jaundice - POA.  Initial LFT's showed Tbili 28.1, Alkphos 203, AST 244, ALT 51.  Lipase was 134.  Discriminate function was 41.8 on admission, qualifies for steroids.  Hepatitis panel was negative.  Ultrasound showed likely cirrhotic liver with increased hepatic echogenicity and slightly nodular hepatic margins, no masses, contracted gallbladder, mild dilation of CBD to 8 mm, small volume ascites. Pancreas was not well visualized. CT findings as above. 6/6: LFT's had improved but are worse today, pt with increased jaundice. --GI following, appreciate their assitance --continue Prednisolone 40 mg daily --reassess scores on day 5 to determine whether to continue steroids --recommend complete alcohol  cessation, discussed medications if needed to help with craving (specifically naltrexone).   --MRCP pending --vitamin K given per GI --optimize nutrition status, high protein low sodium diet --complete alcohol cessation --started on rifaximin 550 mg BID --outpatient EGD to screen for varices  Acute lower UTI - ruled out.  Urine culture with insignificant growth.  Patient without urinary symptoms so will stop antibiotics.  Monitor for symptoms.   Alcohol dependence - last drink Thursday 6/3 evening.  Presented with serum EtOH 92.  Long history of self-medicating for anxiety with alcohol.  Has had very low CIWA scores, not needing Ativan. --CIWA protocol with PRN Ativan --consider Librium if scores rising --consider Naltrexone for alcohol cravings in outpatient setting if needed, discussed with patient  Hypokalemia - POA with potassium 3.1.  Replaced.  Monitor and replace as needed.  Thrombocytopenia  - due to liver disease.  Monitor CBC.  Hypoalbuminemia - due to liver disease and maybe poor nutrition habits.  Monitor BP, edema, LFT's.    Leukocytosis - increasing due to steroids most likely, no fevers or other signs of infection.  Monitor CBC.  Monitor for infection signs/symptoms.  Anxiety, generalized - patient reports history of using alcohol to deal with anxiety and stress.  She reports tremors when sober that are provoked by stress and anxiety.  Has tried Lexapro in past, say just it made her sleep.  She is agreeable to trying another medication (SSRI).    Continuity of Care - Patient has no PCP.  TOC consulted.     Patient BMI: Body mass index  is 34.75 kg/m.   DVT prophylaxis: SCD's  Diet:  Diet Orders (From admission, onward)    Start     Ordered   09/12/19 1543  Diet regular Room service appropriate? Yes; Fluid consistency: Thin  Diet effective now    Question Answer Comment  Room service appropriate? Yes   Fluid consistency: Thin      09/12/19 1542             Code Status: Full Code    Subjective 09/14/19    Patient seen at bedside with daughter and husband at bedside today. She reports feeling well.  Bowel movements improved.  Abdomen less tense feeling.  Good appetite.  No acute complaints.    We discussed naltrexone for alcohol use disorder, to help with cravings, if she feels she needs it once home.  She denies cravings for alcohol or cigarettes so far this admission.   Disposition Plan & Communication   Status is: Inpatient  Remains inpatient appropriate because:Ongoing diagnostic testing needed not appropriate for outpatient work up  Dispo:  Patient From: Home  Planned Disposition: Home  Expected discharge date: 09/15/19  Medically stable for discharge: No   Family Communication: daughter and husband at bedside during encounter    Consults, Procedures, Significant Events   Consultants:   Gastroenterology  Procedures:   None  Antimicrobials:   Rocephin 6/4 >> 6/5 (urine culture with insignificant growth)   Objective   Vitals:   09/12/19 2200 09/13/19 0752 09/13/19 1637 09/13/19 2329  BP:  138/84 139/85 (!) 153/87  Pulse:  90 89 89  Resp: 18 18 17 19   Temp: 98.5 F (36.9 C) 98.2 F (36.8 C) 98.3 F (36.8 C) 98.4 F (36.9 C)  TempSrc: Oral Oral  Oral  SpO2:  97% 97% 96%  Weight:      Height:        Intake/Output Summary (Last 24 hours) at 09/14/2019 0713 Last data filed at 09/13/2019 1039 Gross per 24 hour  Intake 0 ml  Output --  Net 0 ml   Filed Weights   09/11/19 1959  Weight: 86.2 kg    Physical Exam:  General exam: awake, alert, no acute distress HEENT: icteric sclera, moist mucus membranes, hearing grossly normal  Respiratory system: CTAB, no wheezes, rales or rhonchi, normal respiratory effort. Cardiovascular system: normal S1/S2, RRR, no pedal edema.   Gastrointestinal system: Somewhat distended but improved, nontender, +bowel sounds Central nervous system: A&O x4. no gross focal  neurologic deficits, normal speech Extremities: moves all, normal tone Skin: jaundice, dry, intact, normal temperature Psychiatry: normal mood, congruent affect, judgement and insight appear normal  Labs   Data Reviewed: I have personally reviewed following labs and imaging studies  CBC: Recent Labs  Lab 09/11/19 2004 09/12/19 0538 09/12/19 1917 09/14/19 0612  WBC 15.9* 11.0* 12.7* 17.9*  NEUTROABS 10.7*  --  9.7* 13.2*  HGB 13.4 12.3 11.8* 13.8  HCT 36.0 33.3* 31.8* 37.9  MCV 95.2 95.7 94.9 96.9  PLT 138* 129* 131* 173   Basic Metabolic Panel: Recent Labs  Lab 09/11/19 2004 09/12/19 0538 09/13/19 0412  NA 128* 131* 136  K 3.1* 3.3* 4.0  CL 92* 97* 106  CO2 23 24 25   GLUCOSE 103* 127* 151*  BUN 10 8 9   CREATININE UNABLE TO RESULT DUE TO ICTERUS UNABLE TO REPORT DUE TO ICTERUS UNABLE TO RESULT DUE TO ICTERUS  CALCIUM 8.1* 7.7* 7.8*  MG  --  1.7 1.7   GFR: CrCl cannot  be calculated (This lab value cannot be used to calculate CrCl because it is not a number: UNABLE TO RESULT DUE TO ICTERUS). Liver Function Tests: Recent Labs  Lab 09/11/19 2004 09/12/19 0538 09/13/19 0412  AST 244* 212* 177*  ALT 51* 49* 44  ALKPHOS 203* 166* 167*  BILITOT 28.1* 26.4* 24.6*  PROT 7.4 6.6 6.2*  ALBUMIN 2.7* 2.3* 2.0*   Recent Labs  Lab 09/11/19 2004  LIPASE 134*   No results for input(s): AMMONIA in the last 168 hours. Coagulation Profile: Recent Labs  Lab 09/11/19 2231 09/14/19 0612  INR 1.6* 1.7*   Cardiac Enzymes: No results for input(s): CKTOTAL, CKMB, CKMBINDEX, TROPONINI in the last 168 hours. BNP (last 3 results) No results for input(s): PROBNP in the last 8760 hours. HbA1C: No results for input(s): HGBA1C in the last 72 hours. CBG: No results for input(s): GLUCAP in the last 168 hours. Lipid Profile: No results for input(s): CHOL, HDL, LDLCALC, TRIG, CHOLHDL, LDLDIRECT in the last 72 hours. Thyroid Function Tests: No results for input(s): TSH, T4TOTAL,  FREET4, T3FREE, THYROIDAB in the last 72 hours. Anemia Panel: No results for input(s): VITAMINB12, FOLATE, FERRITIN, TIBC, IRON, RETICCTPCT in the last 72 hours. Sepsis Labs: No results for input(s): PROCALCITON, LATICACIDVEN in the last 168 hours.  Recent Results (from the past 240 hour(s))  Urine Culture     Status: Abnormal   Collection Time: 09/11/19  8:04 PM   Specimen: Urine, Clean Catch  Result Value Ref Range Status   Specimen Description   Final    URINE, CLEAN CATCH Performed at [facility], [address], [place], [place] [address]    Special Requests   Final    NONE Performed at [facility], [address] [place], [place] [address]    Culture (A)  Final    <10,000 COLONIES/mL INSIGNIFICANT GROWTH Performed at [facility], [address] [place], [place] [address]    Report Status 09/13/2019 FINAL  Final  SARS Coronavirus 2 by RT PCR (hospital order, performed in [facility] hospital lab) Nasopharyngeal Nasopharyngeal Swab     Status: None   Collection Time: 09/11/19 11:38 PM   Specimen: Nasopharyngeal Swab  Result Value Ref Range Status   SARS Coronavirus 2 NEGATIVE NEGATIVE Final    Comment: (NOTE) SARS-CoV-2 target nucleic acids are NOT DETECTED. The SARS-CoV-2 RNA is generally detectable in upper and lower respiratory specimens during the acute phase of infection. The lowest concentration of SARS-CoV-2 viral copies this assay can detect is 250 copies / mL. A negative result does not preclude SARS-CoV-2 infection and should not be used as the sole basis for treatment or other patient management decisions.  A negative result may occur with improper specimen collection / handling, submission of specimen other than nasopharyngeal swab, presence of viral mutation(s) within the areas targeted by this assay, and inadequate number of viral copies (<250 copies / mL). A negative result must be combined with clinical observations, patient  history, and epidemiological information. Fact Sheet for Patients:   BoilerBrush.com.cy Fact Sheet for Healthcare Providers: https://pope.com/ This test is not yet approved or cleared  by the [place] FDA and has been authorized for detection and/or diagnosis of SARS-CoV-2 by FDA under an Emergency Use Authorization (EUA).  This EUA will remain in effect (meaning this test can be used) for the duration of the COVID-19 declaration under Section 564(b)(1) of the Act, 21 U.S.C. section 360bbb-3(b)(1), unless the authorization is terminated or revoked  sooner. Performed at [facility], [address] [place], [place] [address]       Imaging Studies   US ABDOMEN COMPLETE W/ELASTOGRAPHY  Result Date: 09/12/2019 CLINICAL DATA:  Hepatitis, jaundice, abdominal distension EXAM: ULTRASOUND ABDOMEN ULTRASOUND HEPATIC ELASTOGRAPHY TECHNIQUE: Sonography of the upper abdomen was performed. In addition, ultrasound elastography evaluation of the liver was performed. A region of interest was placed within the right lobe of the liver. Following application of a compressive sonographic pulse, tissue compressibility was assessed. Multiple assessments were performed at the selected site. Median tissue compressibility was determined. Previously, hepatic stiffness was assessed by shear wave velocity. Based on recently published Society of Radiologists in Ultrasound consensus article, reporting is now recommended to be performed in the SI units of pressure (kiloPascals) representing hepatic stiffness/elasticity. The obtained result is compared to the published reference standards. (cACLD = compensated Advanced Chronic Liver Disease) COMPARISON:  CT abdomen pelvis 09/11/2019 FINDINGS: ULTRASOUND ABDOMEN Gallbladder: Contracted with thickened wall. Small amount of pericholecystic fluid as well as ascites. Unable to exclude calculi due to collapsed state. No  sonographic Murphy sign. Common bile duct: Diameter: 8 mm, dilated for age Liver: Coarsened increased echogenicity of liver with slightly nodular hepatic margins suggesting cirrhosis. No discrete hepatic mass. Portal vein is patent on color Doppler imaging with normal direction of blood flow towards the liver. IVC: Short segment visualized, patent on color Doppler imaging Pancreas: Visualized portion of body and tail normal appearance with remainder obscured by bowel gas Spleen: Normal appearance, 9.6 cm length Right Kidney: Length: 14.3 cm. Normal morphology without mass or hydronephrosis. Left Kidney: Length: 12.4 cm. Normal morphology without mass or hydronephrosis. Abdominal aorta: Normal caliber proximally, obscured at mid to distal portions Other findings: Mild perihepatic ascites. ULTRASOUND HEPATIC ELASTOGRAPHY Device: Siemens Helix VTQ Patient position: Supine Transducer 5C1 Number of measurements: 10 Hepatic segment:  8 Median kPa: 20.5 IQR: 2.4 IQR/Median kPa ratio: 0.1 Data quality:  Good, see discussion below in impression Diagnostic category:  See discussion below in impression IMPRESSION: ULTRASOUND ABDOMEN: Likely cirrhotic liver with increased hepatic echogenicity and slightly nodular hepatic margins. No focal hepatic mass identified. Contracted gallbladder, inadequately assessed. Mildly dilated CBD 8 mm diameter, recommend correlation with LFTs and potentially MRCP imaging if there is clinical suspicion of distal biliary obstruction. Small volume ascites. Incomplete pancreatic visualization. ULTRASOUND HEPATIC ELASTOGRAPHY: Median kPa:  20.5 Diagnostic category: > or =17 kPa: highly suggestive of cACLD with an increased probability of clinically significant portal hypertension Patient has history of hepatitis, uncertain if this is acute or chronic. In addition, patient is jaundiced and has a mildly dilated CBD. Hepatic stiffness can increase from multiple etiologies other than fibrosis, including  acute inflammation/hepatitis and cholestasis. Therefore the significance of the obtained kPa = 20.5 is uncertain, unknown if related to fibrosis, hepatitis or cholestasis. Recommend follow-up elastography be performed when patient is asymptomatic, at baseline, to reassess and exclude non fibrotic etiologies. The use of hepatic elastography is applicable to patients with viral hepatitis and non-alcoholic fatty liver disease. At this time, there is insufficient data for the referenced cut-off values and use in other causes of liver disease, including alcoholic liver disease. Patients, however, may be assessed by elastography and serve as their own reference standard/baseline. In patients with non-alcoholic liver disease, the values suggesting compensated advanced chronic liver disease (cACLD) may be lower, and patients may need additional testing with elasticity results of 7-9 kPa. Please note that abnormal hepatic elasticity and shear wave velocities may also be identified  in clinical settings other than with hepatic fibrosis, such as: acute hepatitis, elevated right heart and central venous pressures including use of beta blockers, veno-occlusive disease (Budd-Chiari), infiltrative processes such as mastocytosis/amyloidosis/infiltrative tumor/lymphoma, extrahepatic cholestasis, with hyperemia in the post-prandial state, and with liver transplantation. Correlation with patient history, laboratory data, and clinical condition recommended. Diagnostic Categories: < or =5 kPa: high probability of being normal < or =9 kPa: in the absence of other known clinical signs, rules out cACLD >9 kPa and ?13 kPa: suggestive of cACLD, but needs further testing >13 kPa: highly suggestive of cACLD > or =17 kPa: highly suggestive of cACLD with an increased probability of clinically significant portal hypertension Electronically Signed   By: Lavonia Dana M.D.   On: 09/12/2019 12:57     Medications   Scheduled Meds: . folic acid  1  mg Oral Daily  . hydrocortisone   Rectal TID  . LORazepam  0-4 mg Oral Q12H  . multivitamin with minerals  1 tablet Oral Daily  . nicotine  21 mg Transdermal Daily  . phytonadione  10 mg Subcutaneous Daily  . prednisoLONE  40 mg Oral Daily  . senna  1 tablet Oral BID  . thiamine  100 mg Oral Daily   Continuous Infusions:      LOS: 2 days    Time spent: 30 minutes    Ezekiel Slocumb, DO [facility]  09/14/2019, 7:13 AM    If 7PM-7AM, please contact night-coverage. How to contact the [facility] Attending or Consulting provider [place] or covering provider during after hours [place], for this patient?    1. Check the care team in [facility] and look for a) attending/consulting [facility] provider listed and b) the [facility] team listed 2. Log into [email or web address] and use [place]'s universal password to access. If you do not have the password, please contact the hospital operator. 3. Locate the [facility] provider you are looking for under [facility] and page to a number that you can be directly reached. 4. If you still have difficulty reaching the provider, please page the [facility] (Director on Call) for the Hospitalists listed on amion for assistance.

## 2019-09-15 ENCOUNTER — Inpatient Hospital Stay: Payer: Managed Care, Other (non HMO)

## 2019-09-15 ENCOUNTER — Encounter: Payer: Self-pay | Admitting: Internal Medicine

## 2019-09-15 DIAGNOSIS — K759 Inflammatory liver disease, unspecified: Secondary | ICD-10-CM

## 2019-09-15 DIAGNOSIS — R17 Unspecified jaundice: Secondary | ICD-10-CM

## 2019-09-15 LAB — CBC WITH DIFFERENTIAL/PLATELET
Abs Immature Granulocytes: 0.3 10*3/uL — ABNORMAL HIGH (ref 0.00–0.07)
Basophils Absolute: 0.1 10*3/uL (ref 0.0–0.1)
Basophils Relative: 1 %
Eosinophils Absolute: 0.1 10*3/uL (ref 0.0–0.5)
Eosinophils Relative: 0 %
HCT: 34.6 % — ABNORMAL LOW (ref 36.0–46.0)
Hemoglobin: 12.6 g/dL (ref 12.0–15.0)
Immature Granulocytes: 2 %
Lymphocytes Relative: 10 %
Lymphs Abs: 1.4 10*3/uL (ref 0.7–4.0)
MCH: 35.4 pg — ABNORMAL HIGH (ref 26.0–34.0)
MCHC: 36.4 g/dL — ABNORMAL HIGH (ref 30.0–36.0)
MCV: 97.2 fL (ref 80.0–100.0)
Monocytes Absolute: 1.6 10*3/uL — ABNORMAL HIGH (ref 0.1–1.0)
Monocytes Relative: 11 %
Neutro Abs: 10.6 10*3/uL — ABNORMAL HIGH (ref 1.7–7.7)
Neutrophils Relative %: 76 %
Platelets: 138 10*3/uL — ABNORMAL LOW (ref 150–400)
RBC: 3.56 MIL/uL — ABNORMAL LOW (ref 3.87–5.11)
RDW: 19.6 % — ABNORMAL HIGH (ref 11.5–15.5)
WBC: 14 10*3/uL — ABNORMAL HIGH (ref 4.0–10.5)
nRBC: 0 % (ref 0.0–0.2)

## 2019-09-15 LAB — COMPREHENSIVE METABOLIC PANEL
ALT: 66 U/L — ABNORMAL HIGH (ref 0–44)
AST: 174 U/L — ABNORMAL HIGH (ref 15–41)
Albumin: 1.9 g/dL — ABNORMAL LOW (ref 3.5–5.0)
Alkaline Phosphatase: 143 U/L — ABNORMAL HIGH (ref 38–126)
Anion gap: 8 (ref 5–15)
BUN: 13 mg/dL (ref 6–20)
CO2: 22 mmol/L (ref 22–32)
Calcium: 7.6 mg/dL — ABNORMAL LOW (ref 8.9–10.3)
Chloride: 103 mmol/L (ref 98–111)
Creatinine, Ser: UNDETERMINED mg/dL (ref 0.44–1.00)
Glucose, Bld: 83 mg/dL (ref 70–99)
Potassium: 3.4 mmol/L — ABNORMAL LOW (ref 3.5–5.1)
Sodium: 133 mmol/L — ABNORMAL LOW (ref 135–145)
Total Bilirubin: 23.2 mg/dL (ref 0.3–1.2)
Total Protein: 5.6 g/dL — ABNORMAL LOW (ref 6.5–8.1)

## 2019-09-15 LAB — PROTIME-INR
INR: 1.7 — ABNORMAL HIGH (ref 0.8–1.2)
Prothrombin Time: 19.4 seconds — ABNORMAL HIGH (ref 11.4–15.2)

## 2019-09-15 MED ORDER — POTASSIUM CHLORIDE CRYS ER 20 MEQ PO TBCR
40.0000 meq | EXTENDED_RELEASE_TABLET | Freq: Once | ORAL | Status: AC
  Administered 2019-09-15: 12:00:00 40 meq via ORAL
  Filled 2019-09-15: qty 2

## 2019-09-15 MED ORDER — GADOBUTROL 1 MMOL/ML IV SOLN
7.0000 mL | Freq: Once | INTRAVENOUS | Status: AC | PRN
  Administered 2019-09-15: 10:00:00 7 mL via INTRAVENOUS

## 2019-09-15 NOTE — Progress Notes (Signed)
Marcia Strickland , MD [address], [place], [place], [place], [address] [address], [place], [place], [place], [address] Phone: [phone]  Fax: [phone]   Marcia Strickland is being followed for alcoholic hepatitis  Day 2 of follow up   Subjective: Patient had several episodes of bowel movements overnight due to lactulose.  She just returned from MRCP.  She denies any complaints otherwise, feeling tired.  Daughter is bedside   Objective: Vital signs in last 24 hours: Vitals:   09/14/19 0744 09/15/19 0504 09/15/19 0842 09/15/19 1700  BP: 132/87 (!) 145/80 134/88 135/84  Pulse: 80 86 97 99  Resp: 16 18 16 17   Temp: 98.2 F (36.8 C) 99.1 F (37.3 C) 98.8 F (37.1 C) 98.7 F (37.1 C)  TempSrc: Oral     SpO2: 98% 96% 99% 99%  Weight:      Height:       Weight change:  No intake or output data in the 24 hours ending 09/15/19 1810   Exam: Heart:: Regular rate and rhythm, S1S2 present or without murmur or extra heart sounds Lungs: normal, clear to auscultation and clear to auscultation and percussion Abdomen: soft, nontender, normal bowel sounds   Lab Results: CBC Latest Ref Rng & Units 09/15/2019 09/14/2019 09/12/2019  WBC 4.0 - 10.5 K/uL 14.0(H) 17.9(H) 12.7(H)  Hemoglobin 12.0 - 15.0 g/dL 12.6 13.8 11.8(L)  Hematocrit 36.0 - 46.0 % 34.6(L) 37.9 31.8(L)  Platelets 150 - 400 K/uL 138(L) 173 131(L)   CMP Latest Ref Rng & Units 09/15/2019 09/14/2019 09/13/2019  Glucose 70 - 99 mg/dL 83 96 151(H)  BUN 6 - 20 mg/dL 13 10 9   Creatinine 0.44 - 1.00 mg/dL UNABLE TO REPORT DUE TO ICTERUS UNABLE TO REPORT DUE TO ICTERUS UNABLE TO RESULT DUE TO ICTERUS  Sodium 135 - 145 mmol/L 133(L) 136 136  Potassium 3.5 - 5.1 mmol/L 3.4(L) 3.8 4.0  Chloride 98 - 111 mmol/L 103 104 106  CO2 22 - 32 mmol/L 22 24 25   Calcium 8.9 - 10.3 mg/dL 7.6(L) 8.1(L) 7.8(L)  Total Protein 6.5 - 8.1 g/dL 5.6(L) 6.9 6.2(L)  Total Bilirubin 0.3 - 1.2 mg/dL 23.2(HH) 27.9(HH) 24.6(HH)  Alkaline Phos 38 - 126 U/L  143(H) 186(H) 167(H)  AST 15 - 41 U/L 174(H) 196(H) 177(H)  ALT 0 - 44 U/L 66(H) 58(H) 44    Micro Results: Recent Results (from the past 240 hour(s))  Urine Culture     Status: Abnormal   Collection Time: 09/11/19  8:04 PM   Specimen: Urine, Clean Catch  Result Value Ref Range Status   Specimen Description   Final    URINE, CLEAN CATCH Performed at [facility], [address] [place], [place] [address]    Special Requests   Final    NONE Performed at [facility], [address], [place], [place] [address]    Culture (A)  Final    <10,000 COLONIES/mL INSIGNIFICANT GROWTH Performed at [facility], [place] [address], [place], [place] [address]    Report Status 09/13/2019 FINAL  Final  SARS Coronavirus 2 by RT PCR (hospital order, performed in [facility] hospital lab) Nasopharyngeal Nasopharyngeal Swab     Status: None   Collection Time: 09/11/19 11:38 PM   Specimen: Nasopharyngeal Swab  Result Value Ref Range Status   SARS Coronavirus 2 NEGATIVE NEGATIVE Final    Comment: (NOTE) SARS-CoV-2 target nucleic acids are NOT DETECTED. The SARS-CoV-2 RNA is generally detectable in upper  and lower respiratory specimens during the acute phase of infection. The lowest concentration of SARS-CoV-2 viral copies this assay can detect is 250 copies / mL. A negative result does not preclude SARS-CoV-2 infection and should not be used as the sole basis for treatment or other patient management decisions.  A negative result may occur with improper specimen collection / handling, submission of specimen other than nasopharyngeal swab, presence of viral mutation(s) within the areas targeted by this assay, and inadequate number of viral copies (<250 copies / mL). A negative result must be combined with clinical observations, patient history, and epidemiological information. Fact Sheet for Patients:   BoilerBrush.com.cy Fact Sheet for Healthcare  Providers: https://pope.com/ This test is not yet approved or cleared  by the [place] FDA and has been authorized for detection and/or diagnosis of SARS-CoV-2 by FDA under an Emergency Use Authorization (EUA).  This EUA will remain in effect (meaning this test can be used) for the duration of the COVID-19 declaration under Section 564(b)(1) of the Act, 21 U.S.C. section 360bbb-3(b)(1), unless the authorization is terminated or revoked sooner. Performed at [facility], [address] [place], [place] [address]    Studies/Results: MR 3D Recon At Scanner  Result Date: 09/15/2019 CLINICAL DATA:  Liver failure, jaundice, alcohol abuse EXAM: MRI ABDOMEN WITHOUT AND WITH CONTRAST (INCLUDING MRCP) TECHNIQUE: Multiplanar multisequence MR imaging of the abdomen was performed both before and after the administration of intravenous contrast. Heavily T2-weighted images of the biliary and pancreatic ducts were obtained, and three-dimensional MRCP images were rendered by post processing. CONTRAST:  84mL GADAVIST GADOBUTROL 1 MMOL/ML IV SOLN COMPARISON:  CT abdomen pelvis, 09/11/2019 FINDINGS: Examination is generally somewhat limited by extensive breath motion artifact throughout. Lower chest: No acute findings.  Bilateral breast implants. Hepatobiliary: Hepatic steatosis and heterogeneous, fibrotic enhancement. No mass or other parenchymal abnormality identified. There is pericholecystic fluid and mild gallbladder wall thickening, nonspecific in the setting of ascites. Pancreas: No mass, inflammatory changes, or other parenchymal abnormality identified. No pancreatic ductal dilatation, however the pancreatic duct is poorly visualized due to motion artifact. Spleen:  At the upper limit of normal size. Adrenals/Urinary Tract: No masses identified. No evidence of hydronephrosis. Stomach/Bowel: Visualized portions within the abdomen are unremarkable. Vascular/Lymphatic: No  pathologically enlarged lymph nodes identified. No abdominal aortic aneurysm demonstrated. Other: Small volume ascites throughout the abdomen. Small, fat and fluid containing midline epigastric hernia, partially imaged. Musculoskeletal: No suspicious bone lesions identified. IMPRESSION: 1. Examination is generally limited by extensive breath motion artifact throughout. Within this limitation, there is hepatic steatosis and heterogeneous, fibrotic enhancement suggesting cirrhosis, however without overt morphologic stigmata. There is no biliary ductal dilatation or other abnormality. 2. Small volume ascites throughout the abdomen. 3. There is pericholecystic fluid and mild gallbladder wall thickening, nonspecific in the setting of ascites. 4. Small, fat and fluid containing midline epigastric hernia, partially imaged. Electronically Signed   By: Lauralyn Primes M.D.   On: 09/15/2019 10:58   MR ABDOMEN MRCP W WO CONTAST  Result Date: 09/15/2019 CLINICAL DATA:  Liver failure, jaundice, alcohol abuse EXAM: MRI ABDOMEN WITHOUT AND WITH CONTRAST (INCLUDING MRCP) TECHNIQUE: Multiplanar multisequence MR imaging of the abdomen was performed both before and after the administration of intravenous contrast. Heavily T2-weighted images of the biliary and pancreatic ducts were obtained, and three-dimensional MRCP images were rendered by post processing. CONTRAST:  79mL GADAVIST GADOBUTROL 1 MMOL/ML IV SOLN COMPARISON:  CT abdomen pelvis, 09/11/2019 FINDINGS: Examination is generally somewhat limited by extensive breath  motion artifact throughout. Lower chest: No acute findings.  Bilateral breast implants. Hepatobiliary: Hepatic steatosis and heterogeneous, fibrotic enhancement. No mass or other parenchymal abnormality identified. There is pericholecystic fluid and mild gallbladder wall thickening, nonspecific in the setting of ascites. Pancreas: No mass, inflammatory changes, or other parenchymal abnormality identified. No  pancreatic ductal dilatation, however the pancreatic duct is poorly visualized due to motion artifact. Spleen:  At the upper limit of normal size. Adrenals/Urinary Tract: No masses identified. No evidence of hydronephrosis. Stomach/Bowel: Visualized portions within the abdomen are unremarkable. Vascular/Lymphatic: No pathologically enlarged lymph nodes identified. No abdominal aortic aneurysm demonstrated. Other: Small volume ascites throughout the abdomen. Small, fat and fluid containing midline epigastric hernia, partially imaged. Musculoskeletal: No suspicious bone lesions identified. IMPRESSION: 1. Examination is generally limited by extensive breath motion artifact throughout. Within this limitation, there is hepatic steatosis and heterogeneous, fibrotic enhancement suggesting cirrhosis, however without overt morphologic stigmata. There is no biliary ductal dilatation or other abnormality. 2. Small volume ascites throughout the abdomen. 3. There is pericholecystic fluid and mild gallbladder wall thickening, nonspecific in the setting of ascites. 4. Small, fat and fluid containing midline epigastric hernia, partially imaged. Electronically Signed   By: Lauralyn Primes M.D.   On: 09/15/2019 10:58   Medications: I have reviewed the patient's current medications. Scheduled Meds: . folic acid  1 mg Oral Daily  . hydrocortisone   Rectal TID  . LORazepam  0-4 mg Oral Q12H  . multivitamin with minerals  1 tablet Oral Daily  . nicotine  21 mg Transdermal Daily  . prednisoLONE  40 mg Oral Daily  . rifaximin  550 mg Oral BID  . senna  1 tablet Oral BID  . thiamine  100 mg Oral Daily   Continuous Infusions:  PRN Meds:.ondansetron (ZOFRAN) IV, traMADol   Assessment: Principal Problem:   Alcoholic hepatitis with ascites Active Problems:   Jaundice   Acute lower UTI   Hypokalemia   Thrombocytopenia (HCC)   Alcohol dependence (HCC)   Anxiety, generalized   Marcia Strickland 54 y.o.  Admitted with  alcoholic hepatitis . DF 41.8. Acute hepattiis panel negative. Tbil 24.6. INR 1.6 . USG shows CBD 8 mm  Patient does have cirrhosis of liver  Plan: 1. Continue Prednisolone -started on 6/5  check lillie score at day 5 to decide whether to continue steroids or stop Total bilirubin decreased to 23.2 today Closely monitor for signs of infection, urine output  2. Good nutrition, high-protein diet  3. Stop all alcohol intake  4. MRCP unremarkable for choledocholithiasis   5. Vitamin K for any malnutrition related vitamin K deficiencies, day 3  6. Outpatient EGD to screen for varices  7.  Continue rifaximin 550 mg twice daily  8.  Low-sodium diet   LOS: 3 days   Lannette Donath, MD 09/15/2019, 6:10 PM

## 2019-09-15 NOTE — TOC Initial Note (Signed)
Transition of Care [facility]) - Initial/Assessment Note    Patient Details  Name: Marcia Strickland MRN: [id number] Date of Birth: 1965-11-17  Transition of Care [facility]) CM/SW Contact:    Allayne Butcher, RN Phone Number: 09/15/2019, 12:47 PM  Clinical Narrative:                 Patient admitted to the hospital with alcoholic hepatitis with ascites.  Patient is from home with no significant medical history.  Patient is independent at home and works for [employer].  Patient's husband is at the bedside and reports that he is disabled and will be at home with her and able to help if needed.  Patient has an appointment at [facility] on July 9th for primary care provider.  No discharge needs identified at this time.    Expected Discharge Plan: Home/Self Care Barriers to Discharge: Continued Medical Work up   Patient Goals and CMS Choice Patient states their goals for this hospitalization and ongoing recovery are:: hopes to go home soon      Expected Discharge Plan and Services Expected Discharge Plan: Home/Self Care   Discharge Planning Services: CM Consult   Living arrangements for the past 2 months: Single Family Home                                      Prior Living Arrangements/Services Living arrangements for the past 2 months: Single Family Home Lives with:: Spouse Patient language and need for interpreter reviewed:: Yes Do you feel safe going back to the place where you live?: Yes      Need for Family Participation in Patient Care: Yes (Comment)(cirrhosis) Care giver support system in place?: Yes (comment)(husband)   Criminal Activity/Legal Involvement Pertinent to Current Situation/Hospitalization: No - Comment as needed  Activities of Daily Living   ADL Screening (condition at time of admission) Is the patient deaf or have difficulty hearing?: No Does the patient have difficulty seeing, even when wearing glasses/contacts?: No Does the patient have difficulty  concentrating, remembering, or making decisions?: No Does the patient have difficulty dressing or bathing?: No Does the patient have difficulty walking or climbing stairs?: No  Permission Sought/Granted Permission sought to share information with : Family Supports, Case Manager Permission granted to share information with : Yes, Verbal Permission Granted        Permission granted to share info w Relationship: husband     Emotional Assessment Appearance:: Appears stated age Attitude/Demeanor/Rapport: Engaged Affect (typically observed): Accepting Orientation: : Oriented to Self, Oriented to Place, Oriented to  Time, Oriented to Situation Alcohol / Substance Use: Not Applicable Psych Involvement: No (comment)  Admission diagnosis:  Hepatitis [K75.9] Patient Active Problem List   Diagnosis Date Noted  . Hypokalemia 09/13/2019  . Thrombocytopenia (HCC) 09/13/2019  . Alcohol dependence (HCC) 09/13/2019  . Anxiety, generalized 09/13/2019  . Jaundice 09/12/2019  . Alcoholic hepatitis with ascites 09/12/2019  . Acute lower UTI 09/12/2019   PCP:  Patient, No Pcp Per Pharmacy:  No Pharmacies Listed    Social Determinants of Health (SDOH) Interventions    Readmission Risk Interventions No flowsheet data found.

## 2019-09-15 NOTE — Progress Notes (Signed)
Notified MD shah critical bilirubin 23.2 at 0748

## 2019-09-15 NOTE — Progress Notes (Signed)
PROGRESS NOTE    Marcia Strickland   FHL:[id number]  DOB: January 22, 1966  PCP: Patient, No Pcp Per    DOA: 09/11/2019 LOS: 3   Brief Narrative   Marcia Strickland is a 54 y.o. female with no known past, has not seen a physician in about 10 years. She presented to the ED on 09/11/19 with progressive jaundice, poor appetite and generalized weakness.  This developed after having flu-like symptoms for a week or more.  No prior history of liver disease.  She reported drinking about 1 pint/week of vodka.  In the ED, hemodynamically stable, with jaundice and scleral icterus.  Labs notable for elevated PT, aPTT and INR, potassium 3.1, lipase 134, alkPhos 203, AST 244, ALT 51, Tbili 28.1, WBC 15.9k.  UA was positive for infection.  EtOH level 92.  CT abdomen/pelvis showed hepatic steatosis, portal hypertension with varices.  GI was consulted in the ED, recommended treating with steroids and admission.  Admitted to hospitalist service with GI following.     Assessment & Plan   Principal Problem:   Alcoholic hepatitis with ascites Active Problems:   Jaundice   Acute lower UTI   Hypokalemia   Thrombocytopenia (HCC)   Alcohol dependence (HCC)   Anxiety, generalized   Alcoholic hepatitis with ascites and jaundice - POA.  Initial LFT's showed Tbili 28.1, Alkphos 203, AST 244, ALT 51.  Lipase was 134.  Discriminate function was 41.8 on admission, qualifies for steroids.  Hepatitis panel was negative.  Ultrasound showed likely cirrhotic liver with increased hepatic echogenicity and slightly nodular hepatic margins, no masses, contracted gallbladder, mild dilation of CBD to 8 mm, small volume ascites. Pancreas was not well visualized. CT findings as above. 6/6: LFT's Improving. Jaundice improving --GI following, appreciate their assitance --continue Prednisolone 40 mg daily - started 6/5 - check lillie score at day 5 to decide whether to continue steroids or stop - MRCP unremarkable for choledocholithiasis    --vitamin K given per GI --optimize nutrition status, high protein low sodium diet --complete alcohol cessation --continue rifaximin 550 mg BID --outpatient EGD to screen for varices  Acute lower UTI - ruled out.  Urine culture with insignificant growth.  Patient without urinary symptoms so stop antibiotics.  Monitor for symptoms.   Alcohol dependence - last drink Thursday 6/3 evening.  Presented with serum EtOH 92.  Long history of self-medicating for anxiety with alcohol.  Has had very low CIWA scores, not needing Ativan. --CIWA protocol with PRN Ativan --consider Librium if scores rising --consider Naltrexone for alcohol cravings in outpatient setting if needed, discussed with patient  Hypokalemia - POA with potassium 3.1.  Replaced.  Monitor and replace as needed.  Thrombocytopenia  - due to liver disease.  Monitor CBC.  Hypoalbuminemia - due to liver disease and maybe poor nutrition habits.  Monitor BP, edema, LFT's.    Leukocytosis - increasing due to steroids most likely, no fevers or other signs of infection.  Monitor CBC.  Monitor for infection signs/symptoms.  Anxiety, generalized - patient reports history of using alcohol to deal with anxiety and stress.  She reports tremors when sober that are provoked by stress and anxiety.  Has tried Lexapro in past, say just it made her sleep.  She is agreeable to trying another medication (SSRI).    Continuity of Care - Patient has no PCP.  TOC consulted.     Patient BMI: Body mass index is 34.75 kg/m.   DVT prophylaxis: SCD's  Diet:  Diet Orders (  From admission, onward)    Start     Ordered   09/14/19 1352  Diet 2 gram sodium Room service appropriate? Yes; Fluid consistency: Thin  Diet effective now    Question Answer Comment  Room service appropriate? Yes   Fluid consistency: Thin      09/14/19 1351            Code Status: Full Code    Subjective 09/15/19    Overall improving. Still concerned about her  liver  Disposition Plan & Communication   Status is: Inpatient  Remains inpatient appropriate because:Ongoing diagnostic testing needed not appropriate for outpatient work up, needs lilli score on day 5 of steroids (6/9) before decision for D/C per GI  Dispo:  Patient From: Home  Planned Disposition: Home  Expected discharge date: 09/17/19  Medically stable for discharge: No   Family Communication: discussed with patient. No family at bedside   Consults, Procedures, Significant Events   Consultants:   Gastroenterology  Procedures:   None  Antimicrobials:   Rocephin 6/4 >> 6/5 (urine culture with insignificant growth)   Objective   Vitals:   09/14/19 0744 09/15/19 0504 09/15/19 0842 09/15/19 1700  BP: 132/87 (!) 145/80 134/88 135/84  Pulse: 80 86 97 99  Resp: 16 18 16 17   Temp: 98.2 F (36.8 C) 99.1 F (37.3 C) 98.8 F (37.1 C) 98.7 F (37.1 C)  TempSrc: Oral     SpO2: 98% 96% 99% 99%  Weight:      Height:        Intake/Output Summary (Last 24 hours) at 09/15/2019 2006 Last data filed at 09/15/2019 1700 Gross per 24 hour  Intake 240 ml  Output --  Net 240 ml   Filed Weights   09/11/19 1959  Weight: 86.2 kg    Physical Exam:  General exam: awake, alert, no acute distress HEENT: icteric sclera, moist mucus membranes, hearing grossly normal  Respiratory system: CTAB, no wheezes, rales or rhonchi, normal respiratory effort. Cardiovascular system: normal S1/S2, RRR, no pedal edema.   Gastrointestinal system: Somewhat distended but improved, nontender, +bowel sounds Central nervous system: A&O x4. no gross focal neurologic deficits, normal speech Extremities: moves all, normal tone Skin: jaundice, dry, intact, normal temperature Psychiatry: normal mood, congruent affect, judgement and insight appear normal  Labs   Data Reviewed: I have personally reviewed following labs and imaging studies  CBC: Recent Labs  Lab 09/11/19 2004 09/12/19 0538  09/12/19 1917 09/14/19 0612 09/15/19 0614  WBC 15.9* 11.0* 12.7* 17.9* 14.0*  NEUTROABS 10.7*  --  9.7* 13.2* 10.6*  HGB 13.4 12.3 11.8* 13.8 12.6  HCT 36.0 33.3* 31.8* 37.9 34.6*  MCV 95.2 95.7 94.9 96.9 97.2  PLT 138* 129* 131* 173 419*   Basic Metabolic Panel: Recent Labs  Lab 09/11/19 2004 09/12/19 0538 09/13/19 0412 09/14/19 0612 09/15/19 0614  NA 128* 131* 136 136 133*  K 3.1* 3.3* 4.0 3.8 3.4*  CL 92* 97* 106 104 103  CO2 23 24 25 24 22   GLUCOSE 103* 127* 151* 96 83  BUN 10 8 9 10 13   CREATININE UNABLE TO RESULT DUE TO ICTERUS UNABLE TO REPORT DUE TO ICTERUS UNABLE TO RESULT DUE TO ICTERUS UNABLE TO REPORT DUE TO ICTERUS UNABLE TO REPORT DUE TO ICTERUS  CALCIUM 8.1* 7.7* 7.8* 8.1* 7.6*  MG  --  1.7 1.7  --   --    GFR: CrCl cannot be calculated (This lab value cannot be used to calculate CrCl because  it is not a number: UNABLE TO REPORT DUE TO ICTERUS). Liver Function Tests: Recent Labs  Lab 09/11/19 2004 09/12/19 0973 09/13/19 0412 09/14/19 0612 09/15/19 0614  AST 244* 212* 177* 196* 174*  ALT 51* 49* 44 58* 66*  ALKPHOS 203* 166* 167* 186* 143*  BILITOT 28.1* 26.4* 24.6* 27.9* 23.2*  PROT 7.4 6.6 6.2* 6.9 5.6*  ALBUMIN 2.7* 2.3* 2.0* 2.4* 1.9*   Recent Labs  Lab 09/11/19 2004 09/14/19 0612  LIPASE 134* 132*   No results for input(s): AMMONIA in the last 168 hours. Coagulation Profile: Recent Labs  Lab 09/11/19 2231 09/14/19 0612 09/15/19 0614  INR 1.6* 1.7* 1.7*   Cardiac Enzymes: No results for input(s): CKTOTAL, CKMB, CKMBINDEX, TROPONINI in the last 168 hours. BNP (last 3 results) No results for input(s): PROBNP in the last 8760 hours. HbA1C: No results for input(s): HGBA1C in the last 72 hours. CBG: No results for input(s): GLUCAP in the last 168 hours. Lipid Profile: No results for input(s): CHOL, HDL, LDLCALC, TRIG, CHOLHDL, LDLDIRECT in the last 72 hours. Thyroid Function Tests: No results for input(s): TSH, T4TOTAL, FREET4,  T3FREE, THYROIDAB in the last 72 hours. Anemia Panel: No results for input(s): VITAMINB12, FOLATE, FERRITIN, TIBC, IRON, RETICCTPCT in the last 72 hours. Sepsis Labs: No results for input(s): PROCALCITON, LATICACIDVEN in the last 168 hours.  Recent Results (from the past 240 hour(s))  Urine Culture     Status: Abnormal   Collection Time: 09/11/19  8:04 PM   Specimen: Urine, Clean Catch  Result Value Ref Range Status   Specimen Description   Final    URINE, CLEAN CATCH Performed at [facility], [address], [place], [place] [address]    Special Requests   Final    NONE Performed at [facility], [address] [place], [place] [address]    Culture (A)  Final    <10,000 COLONIES/mL INSIGNIFICANT GROWTH Performed at [facility], [address], [place], [place] [address]    Report Status 09/13/2019 FINAL  Final  SARS Coronavirus 2 by RT PCR (hospital order, performed in [facility] hospital lab) Nasopharyngeal Nasopharyngeal Swab     Status: None   Collection Time: 09/11/19 11:38 PM   Specimen: Nasopharyngeal Swab  Result Value Ref Range Status   SARS Coronavirus 2 NEGATIVE NEGATIVE Final    Comment: (NOTE) SARS-CoV-2 target nucleic acids are NOT DETECTED. The SARS-CoV-2 RNA is generally detectable in upper and lower respiratory specimens during the acute phase of infection. The lowest concentration of SARS-CoV-2 viral copies this assay can detect is 250 copies / mL. A negative result does not preclude SARS-CoV-2 infection and should not be used as the sole basis for treatment or other patient management decisions.  A negative result may occur with improper specimen collection / handling, submission of specimen other than nasopharyngeal swab, presence of viral mutation(s) within the areas targeted by this assay, and inadequate number of viral copies (<250 copies / mL). A negative result must be combined with clinical observations, patient history,  and epidemiological information. Fact Sheet for Patients:   BoilerBrush.com.cy Fact Sheet for Healthcare Providers: https://pope.com/ This test is not yet approved or cleared  by the [place] FDA and has been authorized for detection and/or diagnosis of SARS-CoV-2 by FDA under an Emergency Use Authorization (EUA).  This EUA will remain in effect (meaning this test can be used) for the duration of the COVID-19 declaration under Section 564(b)(1) of the Act, 21  U.S.C. section 360bbb-3(b)(1), unless the authorization is terminated or revoked sooner. Performed at [facility], [address] [place], [place] [address]       Imaging Studies   MR 3D Recon At Scanner  Result Date: 09/15/2019 CLINICAL DATA:  Liver failure, jaundice, alcohol abuse EXAM: MRI ABDOMEN WITHOUT AND WITH CONTRAST (INCLUDING MRCP) TECHNIQUE: Multiplanar multisequence MR imaging of the abdomen was performed both before and after the administration of intravenous contrast. Heavily T2-weighted images of the biliary and pancreatic ducts were obtained, and three-dimensional MRCP images were rendered by post processing. CONTRAST:  7mL GADAVIST GADOBUTROL 1 MMOL/ML IV SOLN COMPARISON:  CT abdomen pelvis, 09/11/2019 FINDINGS: Examination is generally somewhat limited by extensive breath motion artifact throughout. Lower chest: No acute findings.  Bilateral breast implants. Hepatobiliary: Hepatic steatosis and heterogeneous, fibrotic enhancement. No mass or other parenchymal abnormality identified. There is pericholecystic fluid and mild gallbladder wall thickening, nonspecific in the setting of ascites. Pancreas: No mass, inflammatory changes, or other parenchymal abnormality identified. No pancreatic ductal dilatation, however the pancreatic duct is poorly visualized due to motion artifact. Spleen:  At the upper limit of normal size. Adrenals/Urinary Tract: No masses  identified. No evidence of hydronephrosis. Stomach/Bowel: Visualized portions within the abdomen are unremarkable. Vascular/Lymphatic: No pathologically enlarged lymph nodes identified. No abdominal aortic aneurysm demonstrated. Other: Small volume ascites throughout the abdomen. Small, fat and fluid containing midline epigastric hernia, partially imaged. Musculoskeletal: No suspicious bone lesions identified. IMPRESSION: 1. Examination is generally limited by extensive breath motion artifact throughout. Within this limitation, there is hepatic steatosis and heterogeneous, fibrotic enhancement suggesting cirrhosis, however without overt morphologic stigmata. There is no biliary ductal dilatation or other abnormality. 2. Small volume ascites throughout the abdomen. 3. There is pericholecystic fluid and mild gallbladder wall thickening, nonspecific in the setting of ascites. 4. Small, fat and fluid containing midline epigastric hernia, partially imaged. Electronically Signed   By: Lauralyn PrimesAlex  Bibbey M.D.   On: 09/15/2019 10:58   MR ABDOMEN MRCP W WO CONTAST  Result Date: 09/15/2019 CLINICAL DATA:  Liver failure, jaundice, alcohol abuse EXAM: MRI ABDOMEN WITHOUT AND WITH CONTRAST (INCLUDING MRCP) TECHNIQUE: Multiplanar multisequence MR imaging of the abdomen was performed both before and after the administration of intravenous contrast. Heavily T2-weighted images of the biliary and pancreatic ducts were obtained, and three-dimensional MRCP images were rendered by post processing. CONTRAST:  7mL GADAVIST GADOBUTROL 1 MMOL/ML IV SOLN COMPARISON:  CT abdomen pelvis, 09/11/2019 FINDINGS: Examination is generally somewhat limited by extensive breath motion artifact throughout. Lower chest: No acute findings.  Bilateral breast implants. Hepatobiliary: Hepatic steatosis and heterogeneous, fibrotic enhancement. No mass or other parenchymal abnormality identified. There is pericholecystic fluid and mild gallbladder wall  thickening, nonspecific in the setting of ascites. Pancreas: No mass, inflammatory changes, or other parenchymal abnormality identified. No pancreatic ductal dilatation, however the pancreatic duct is poorly visualized due to motion artifact. Spleen:  At the upper limit of normal size. Adrenals/Urinary Tract: No masses identified. No evidence of hydronephrosis. Stomach/Bowel: Visualized portions within the abdomen are unremarkable. Vascular/Lymphatic: No pathologically enlarged lymph nodes identified. No abdominal aortic aneurysm demonstrated. Other: Small volume ascites throughout the abdomen. Small, fat and fluid containing midline epigastric hernia, partially imaged. Musculoskeletal: No suspicious bone lesions identified. IMPRESSION: 1. Examination is generally limited by extensive breath motion artifact throughout. Within this limitation, there is hepatic steatosis and heterogeneous, fibrotic enhancement suggesting cirrhosis, however without overt morphologic stigmata. There is no biliary ductal dilatation or other abnormality. 2. Small volume ascites  throughout the abdomen. 3. There is pericholecystic fluid and mild gallbladder wall thickening, nonspecific in the setting of ascites. 4. Small, fat and fluid containing midline epigastric hernia, partially imaged. Electronically Signed   By: Lauralyn Primes M.D.   On: 09/15/2019 10:58     Medications   Scheduled Meds: . folic acid  1 mg Oral Daily  . hydrocortisone   Rectal TID  . LORazepam  0-4 mg Oral Q12H  . multivitamin with minerals  1 tablet Oral Daily  . nicotine  21 mg Transdermal Daily  . prednisoLONE  40 mg Oral Daily  . rifaximin  550 mg Oral BID  . senna  1 tablet Oral BID  . thiamine  100 mg Oral Daily   Continuous Infusions:      LOS: 3 days    Time spent: 30 minutes    Delfino Lovett, MD [facility]  09/15/2019, 8:06 PM    If 7PM-7AM, please contact night-coverage. How to contact the [facility] Attending or Consulting  provider 7A - 7P or covering provider during after hours 7P -7A, for this patient?    1. Check the care team in [facility] and look for a) attending/consulting [facility] provider listed and b) the [facility] team listed 2. Log into [email or web address] and use [facility] universal password to access. If you do not have the password, please contact the hospital operator. 3. Locate the [facility] provider you are looking for under [facility] and page to a number that you can be directly reached. 4. If you still have difficulty reaching the provider, please page the [facility] (Director on Call) for the Hospitalists listed on amion for assistance.

## 2019-09-16 ENCOUNTER — Encounter: Payer: Self-pay | Admitting: Internal Medicine

## 2019-09-16 DIAGNOSIS — K7011 Alcoholic hepatitis with ascites: Principal | ICD-10-CM

## 2019-09-16 LAB — CBC WITH DIFFERENTIAL/PLATELET
Abs Immature Granulocytes: 0.39 10*3/uL — ABNORMAL HIGH (ref 0.00–0.07)
Basophils Absolute: 0.1 10*3/uL (ref 0.0–0.1)
Basophils Relative: 1 %
Eosinophils Absolute: 0 10*3/uL (ref 0.0–0.5)
Eosinophils Relative: 0 %
HCT: 33.4 % — ABNORMAL LOW (ref 36.0–46.0)
Hemoglobin: 12.3 g/dL (ref 12.0–15.0)
Immature Granulocytes: 3 %
Lymphocytes Relative: 7 %
Lymphs Abs: 1 10*3/uL (ref 0.7–4.0)
MCH: 35.8 pg — ABNORMAL HIGH (ref 26.0–34.0)
MCHC: 36.8 g/dL — ABNORMAL HIGH (ref 30.0–36.0)
MCV: 97.1 fL (ref 80.0–100.0)
Monocytes Absolute: 1.6 10*3/uL — ABNORMAL HIGH (ref 0.1–1.0)
Monocytes Relative: 11 %
Neutro Abs: 11.4 10*3/uL — ABNORMAL HIGH (ref 1.7–7.7)
Neutrophils Relative %: 78 %
Platelets: 133 10*3/uL — ABNORMAL LOW (ref 150–400)
RBC: 3.44 MIL/uL — ABNORMAL LOW (ref 3.87–5.11)
RDW: 19.8 % — ABNORMAL HIGH (ref 11.5–15.5)
WBC: 14.5 10*3/uL — ABNORMAL HIGH (ref 4.0–10.5)
nRBC: 0 % (ref 0.0–0.2)

## 2019-09-16 LAB — PROTIME-INR
INR: 1.7 — ABNORMAL HIGH (ref 0.8–1.2)
Prothrombin Time: 19.7 seconds — ABNORMAL HIGH (ref 11.4–15.2)

## 2019-09-16 MED ORDER — LACTULOSE 10 GM/15ML PO SOLN: 20.0000 g | Freq: Two times a day (BID) | ORAL | Status: DC | PRN

## 2019-09-16 NOTE — Consult Note (Signed)
[facility]  CONSULT NOTE    Date: 09/16/2019                  Patient Name:  Marcia Strickland  MRN: [id number]  DOB: 17-Mar-1966  Age / Sex: 54 y.o., female         PCP: Patient, No Pcp Per                 Service Requesting Consult: Dr. Sherryll Burger                 Reason for Consult: Acute renal failure            History of Present Illness: [name] is a 54 y.o. white female with , who was admitted to [facility] on 09/11/2019 for acute hepatitis. Patient was drinking vodka daily for several years. Patient had not seen a physician or health care provider in over ten years. She is not eating or drinking for last few days. Decline appetite. Presents to ED when jaundice appeared.    Medications: Outpatient medications: Medications Prior to Admission  Medication Sig Dispense Refill Last Dose  . aspirin EC 81 MG tablet Take 81 mg by mouth daily.   09/11/2019 at Unknown time  . diphenhydrAMINE HCl (BENADRYL ALLERGY PO) Take 25 mg by mouth daily.   09/11/2019 at Unknown time  . ferrous sulfate 325 (65 FE) MG tablet Take 325 mg by mouth daily with breakfast.   09/11/2019 at Unknown time  . Potassium 99 MG TABS Take 1 tablet by mouth every Friday.   Past Week at Unknown time  . Probiotic Product (PROBIOTIC ACIDOPHILUS BIOBEADS PO) Take 1 capsule by mouth daily.   09/11/2019 at Unknown time  . Turmeric (QC TUMERIC COMPLEX PO) Take by mouth.   09/11/2019 at Unknown time  . vitamin B-12 (CYANOCOBALAMIN) 100 MCG tablet Take 100 mcg by mouth daily.   09/11/2019 at Unknown time    Current medications: Current Facility-Administered Medications  Medication Dose Route Frequency Provider Last Rate Last Admin  . folic acid (FOLVITE) tablet 1 mg  1 mg Oral Daily Norins, Rosalyn Gess, MD   1 mg at 09/16/19 1779  . hydrocortisone (ANUSOL-HC) 2.5 % rectal cream   Rectal TID Pennie Banter, DO   Given at 09/16/19 0825  . multivitamin with minerals tablet 1 tablet  1 tablet Oral Daily Norins,  Rosalyn Gess, MD   1 tablet at 09/16/19 [phone]  . nicotine (NICODERM CQ - dosed in mg/24 hours) patch 21 mg  21 mg Transdermal Daily Manuela Schwartz, NP   21 mg at 09/16/19 0825  . ondansetron (ZOFRAN) injection 4 mg  4 mg Intravenous Q6H PRN Norins, Rosalyn Gess, MD      . prednisoLONE (ORAPRED) 15 MG/5ML solution 40 mg  40 mg Oral Daily Esaw Grandchild A, DO   40 mg at 09/16/19 0823  . rifaximin (XIFAXAN) tablet 550 mg  550 mg Oral BID Toney Reil, MD   550 mg at 09/16/19 0092  . senna (SENOKOT) tablet 8.6 mg  1 tablet Oral BID Jacques Navy, MD   8.6 mg at 09/16/19 3300  . thiamine tablet 100 mg  100 mg Oral Daily Norins, Rosalyn Gess, MD   100 mg at 09/16/19 7622  . traMADol (ULTRAM) tablet 50 mg  50 mg Oral Q8H PRN Norins, Rosalyn Gess, MD          Allergies: No Known Allergies    Past  Medical History: History reviewed. No pertinent past medical history.   Past Surgical History: Past Surgical History:  Procedure Laterality Date  . BREAST SURGERY     implants bilaterally.   . TUBAL LIGATION       Family History: Family History  Problem Relation Age of Onset  . Breast cancer Mother   . Kidney failure Father   . Breast cancer Maternal Grandmother   . Leukemia Maternal Grandfather   . Diabetes Maternal Grandfather   . Hypertension Maternal Grandfather   . CAD Paternal Grandfather   . Peripheral Artery Disease Paternal Grandfather      Social History: Social History   Socioeconomic History  . Marital status: Married    Spouse name: Not on file  . Number of children: Not on file  . Years of education: Not on file  . Highest education level: Not on file  Occupational History  . Not on file  Tobacco Use  . Smoking status: Current Every Day Smoker    Packs/day: 1.00    Years: 30.00    Pack years: 30.00    Types: Cigarettes  . Smokeless tobacco: Never Used  Substance and Sexual Activity  . Alcohol use: Yes  . Drug use: Never  . Sexual activity: Not on file   Other Topics Concern  . Not on file  Social History Narrative  . Not on file   Social Determinants of Health   Financial Resource Strain:   . Difficulty of Paying Living Expenses:   Food Insecurity:   . Worried About Programme researcher, broadcasting/film/videounning Out of Food in the Last Year:   . Baristaan Out of Food in the Last Year:   Transportation Needs:   . Freight forwarderLack of Transportation (Medical):   Marland Kitchen. Lack of Transportation (Non-Medical):   Physical Activity:   . Days of Exercise per Week:   . Minutes of Exercise per Session:   Stress:   . Feeling of Stress :   Social Connections:   . Frequency of Communication with Friends and Family:   . Frequency of Social Gatherings with Friends and Family:   . Attends Religious Services:   . Active Member of Clubs or Organizations:   . Attends BankerClub or Organization Meetings:   Marland Kitchen. Marital Status:   Intimate Partner Violence:   . Fear of Current or Ex-Partner:   . Emotionally Abused:   Marland Kitchen. Physically Abused:   . Sexually Abused:      Review of Systems: Review of Systems  Constitutional: Negative.   HENT: Negative.   Eyes: Negative.   Respiratory: Negative.   Cardiovascular: Negative.   Gastrointestinal: Negative.   Genitourinary: Negative.   Musculoskeletal: Negative.   Skin: Negative.   Neurological: Negative.   Endo/Heme/Allergies: Negative.   Psychiatric/Behavioral: Negative.     Vital Signs: Blood pressure 136/84, pulse 98, temperature 98.6 F (37 C), temperature source Oral, resp. rate 17, height 5\' 2"  (1.575 m), weight 86.2 kg, SpO2 98 %.  Weight trends: Filed Weights   09/11/19 1959  Weight: 86.2 kg    Physical Exam: General: NAD,   Head: Normocephalic, atraumatic. Moist oral mucosal membranes  Eyes: +scleral icterus  Neck: Supple, trachea midline  Lungs:  Clear to auscultation  Heart: Regular rate and rhythm  Abdomen:  Soft, nontender,   Extremities: no peripheral edema.  Neurologic: Nonfocal, moving all four extremities  Skin: +jaundice          Lab results: Basic Metabolic Panel: Recent Labs  Lab 09/12/19 0538 09/12/19 0538 09/13/19  4818 09/13/19 5631 09/14/19 0612 09/15/19 0614 09/16/19 0344  NA 131*   < > 136   < > 136 133* 133*  K 3.3*   < > 4.0   < > 3.8 3.4* 3.6  CL 97*   < > 106   < > 104 103 105  CO2 24   < > 25   < > 24 22 23   GLUCOSE 127*   < > 151*   < > 96 83 112*  BUN 8   < > 9   < > 10 13 14   CREATININE UNABLE TO REPORT DUE TO ICTERUS   < > UNABLE TO RESULT DUE TO ICTERUS   < > UNABLE TO REPORT DUE TO ICTERUS UNABLE TO REPORT DUE TO ICTERUS  UNABLE TO REPORT DUE TO ICTERUS  CALCIUM 7.7*   < > 7.8*   < > 8.1* 7.6* 7.8*  MG 1.7  --  1.7  --   --   --   --    < > = values in this interval not displayed.    Liver Function Tests: Recent Labs  Lab 09/14/19 0612 09/15/19 0614 09/16/19 0344  AST 196* 174* 194*  ALT 58* 66* 78*  ALKPHOS 186* 143* 142*  BILITOT 27.9* 23.2* 25.1*  PROT 6.9 5.6* 5.9*  ALBUMIN 2.4* 1.9* 1.9*   Recent Labs  Lab 09/11/19 2004 09/14/19 0612  LIPASE 134* 132*   No results for input(s): AMMONIA in the last 168 hours.  CBC: Recent Labs  Lab 09/12/19 0538 09/12/19 0538 09/12/19 1917 09/12/19 1917 09/14/19 0612 09/15/19 0614 09/16/19 0344  WBC 11.0*   < > 12.7*   < > 17.9* 14.0* 14.5*  NEUTROABS  --   --  9.7*   < > 13.2* 10.6* 11.4*  HGB 12.3   < > 11.8*   < > 13.8 12.6 12.3  HCT 33.3*   < > 31.8*   < > 37.9 34.6* 33.4*  MCV 95.7  --  94.9  --  96.9 97.2 97.1  PLT 129*   < > 131*   < > 173 138* 133*   < > = values in this interval not displayed.    Cardiac Enzymes: No results for input(s): CKTOTAL, CKMB, CKMBINDEX, TROPONINI in the last 168 hours.  BNP: Invalid input(s): POCBNP  CBG: No results for input(s): GLUCAP in the last 168 hours.  Microbiology: Results for orders placed or performed during the hospital encounter of 09/11/19  Urine Culture     Status: Abnormal   Collection Time: 09/11/19  8:04 PM   Specimen: Urine, Clean Catch  Result  Value Ref Range Status   Specimen Description   Final    URINE, CLEAN CATCH Performed at [facility], [address], [place], [address] [place]    Special Requests   Final    NONE Performed at [facility], [address], [place], [address] [place]    Culture (A)  Final    <10,000 COLONIES/mL INSIGNIFICANT GROWTH Performed at [facility], [address], [place], [address] [place]    Report Status 09/13/2019 FINAL  Final  SARS Coronavirus 2 by RT PCR (hospital order, performed in [facility] hospital lab) Nasopharyngeal Nasopharyngeal Swab     Status: None   Collection Time: 09/11/19 11:38 PM   Specimen: Nasopharyngeal Swab  Result Value Ref Range Status   SARS Coronavirus 2 NEGATIVE NEGATIVE Final    Comment: (NOTE) SARS-CoV-2 target nucleic acids  are NOT DETECTED. The SARS-CoV-2 RNA is generally detectable in upper and lower respiratory specimens during the acute phase of infection. The lowest concentration of SARS-CoV-2 viral copies this assay can detect is 250 copies / mL. A negative result does not preclude SARS-CoV-2 infection and should not be used as the sole basis for treatment or other patient management decisions.  A negative result may occur with improper specimen collection / handling, submission of specimen other than nasopharyngeal swab, presence of viral mutation(s) within the areas targeted by this assay, and inadequate number of viral copies (<250 copies / mL). A negative result must be combined with clinical observations, patient history, and epidemiological information. Fact Sheet for Patients:   StrictlyIdeas.no Fact Sheet for Healthcare Providers: BankingDealers.co.za This test is not yet approved or cleared  by the [place] FDA and has been authorized for detection and/or diagnosis of SARS-CoV-2 by FDA under an Emergency Use Authorization (EUA).  This EUA will remain in  effect (meaning this test can be used) for the duration of the COVID-19 declaration under Section 564(b)(1) of the Act, 21 U.S.C. section 360bbb-3(b)(1), unless the authorization is terminated or revoked sooner. Performed at [facility], [address] [place], [place] [address]     Coagulation Studies: Recent Labs    09/14/19 0612 09/15/19 0614 09/16/19 0344  LABPROT 19.2* 19.4* 19.7*  INR 1.7* 1.7* 1.7*    Urinalysis: No results for input(s): COLORURINE, LABSPEC, PHURINE, GLUCOSEU, HGBUR, BILIRUBINUR, KETONESUR, PROTEINUR, UROBILINOGEN, NITRITE, LEUKOCYTESUR in the last 72 hours.  Invalid input(s): APPERANCEUR    Imaging: MR 3D Recon At Scanner  Result Date: 09/15/2019 CLINICAL DATA:  Liver failure, jaundice, alcohol abuse EXAM: MRI ABDOMEN WITHOUT AND WITH CONTRAST (INCLUDING MRCP) TECHNIQUE: Multiplanar multisequence MR imaging of the abdomen was performed both before and after the administration of intravenous contrast. Heavily T2-weighted images of the biliary and pancreatic ducts were obtained, and three-dimensional MRCP images were rendered by post processing. CONTRAST:  73mL GADAVIST GADOBUTROL 1 MMOL/ML IV SOLN COMPARISON:  CT abdomen pelvis, 09/11/2019 FINDINGS: Examination is generally somewhat limited by extensive breath motion artifact throughout. Lower chest: No acute findings.  Bilateral breast implants. Hepatobiliary: Hepatic steatosis and heterogeneous, fibrotic enhancement. No mass or other parenchymal abnormality identified. There is pericholecystic fluid and mild gallbladder wall thickening, nonspecific in the setting of ascites. Pancreas: No mass, inflammatory changes, or other parenchymal abnormality identified. No pancreatic ductal dilatation, however the pancreatic duct is poorly visualized due to motion artifact. Spleen:  At the upper limit of normal size. Adrenals/Urinary Tract: No masses identified. No evidence of hydronephrosis. Stomach/Bowel:  Visualized portions within the abdomen are unremarkable. Vascular/Lymphatic: No pathologically enlarged lymph nodes identified. No abdominal aortic aneurysm demonstrated. Other: Small volume ascites throughout the abdomen. Small, fat and fluid containing midline epigastric hernia, partially imaged. Musculoskeletal: No suspicious bone lesions identified. IMPRESSION: 1. Examination is generally limited by extensive breath motion artifact throughout. Within this limitation, there is hepatic steatosis and heterogeneous, fibrotic enhancement suggesting cirrhosis, however without overt morphologic stigmata. There is no biliary ductal dilatation or other abnormality. 2. Small volume ascites throughout the abdomen. 3. There is pericholecystic fluid and mild gallbladder wall thickening, nonspecific in the setting of ascites. 4. Small, fat and fluid containing midline epigastric hernia, partially imaged. Electronically Signed   By: Eddie Candle M.D.   On: 09/15/2019 10:58   MR ABDOMEN MRCP W WO CONTAST  Result Date: 09/15/2019 CLINICAL DATA:  Liver failure, jaundice, alcohol abuse EXAM: MRI ABDOMEN WITHOUT AND WITH  CONTRAST (INCLUDING MRCP) TECHNIQUE: Multiplanar multisequence MR imaging of the abdomen was performed both before and after the administration of intravenous contrast. Heavily T2-weighted images of the biliary and pancreatic ducts were obtained, and three-dimensional MRCP images were rendered by post processing. CONTRAST:  85mL GADAVIST GADOBUTROL 1 MMOL/ML IV SOLN COMPARISON:  CT abdomen pelvis, 09/11/2019 FINDINGS: Examination is generally somewhat limited by extensive breath motion artifact throughout. Lower chest: No acute findings.  Bilateral breast implants. Hepatobiliary: Hepatic steatosis and heterogeneous, fibrotic enhancement. No mass or other parenchymal abnormality identified. There is pericholecystic fluid and mild gallbladder wall thickening, nonspecific in the setting of ascites. Pancreas: No  mass, inflammatory changes, or other parenchymal abnormality identified. No pancreatic ductal dilatation, however the pancreatic duct is poorly visualized due to motion artifact. Spleen:  At the upper limit of normal size. Adrenals/Urinary Tract: No masses identified. No evidence of hydronephrosis. Stomach/Bowel: Visualized portions within the abdomen are unremarkable. Vascular/Lymphatic: No pathologically enlarged lymph nodes identified. No abdominal aortic aneurysm demonstrated. Other: Small volume ascites throughout the abdomen. Small, fat and fluid containing midline epigastric hernia, partially imaged. Musculoskeletal: No suspicious bone lesions identified. IMPRESSION: 1. Examination is generally limited by extensive breath motion artifact throughout. Within this limitation, there is hepatic steatosis and heterogeneous, fibrotic enhancement suggesting cirrhosis, however without overt morphologic stigmata. There is no biliary ductal dilatation or other abnormality. 2. Small volume ascites throughout the abdomen. 3. There is pericholecystic fluid and mild gallbladder wall thickening, nonspecific in the setting of ascites. 4. Small, fat and fluid containing midline epigastric hernia, partially imaged. Electronically Signed   By: Lauralyn Primes M.D.   On: 09/15/2019 10:58      Assessment & Plan: Ms. BRESHA HOSACK is a 54 y.o. white female with no significant past medication history , who was admitted to [facility] on 09/11/2019 for Hepatitis [K75.9]  1. Acute renal failure with no known baseline creatinine, glycosuria and proteinuria. Most likely ATN from acute hepatitis, poor PO intake but there could be bile cast nephropathy.  Creatinine is unable to be quantified due to hyperbilirubinemia.  - Check cystatin C  2. Hyponatremia: secondary to liver failure.   3. Acute alcoholic hepatitis. Viral panel negative. HIV negative.  - prednisolone - Appreciate GI input.    LOS: 4 Kayler Buckholtz 6/8/202112:29  PM

## 2019-09-16 NOTE — Progress Notes (Signed)
[name] , MD [address], [address], [place], [place], [address] [address], [place], [place], [address] Phone: [phone]  Fax: [phone]   Subjective: Patient reports doing okay today.  Denies any complaints other than going home, unable to sleep during nights, got some books to read from home.  She wanted to know if rifaximin can cause elevated LFTs as they are worse today  Objective: Vital signs in last 24 hours: Vitals:   09/16/19 0357 09/16/19 0749 09/16/19 1200 09/16/19 1618  BP: 126/87 (!) 143/92 136/84 (!) 162/96  Pulse: 86 (!) 101 98 91  Resp: 20 18 17 14   Temp: 98.4 F (36.9 C) 98.2 F (36.8 C) 98.6 F (37 C) 98.1 F (36.7 C)  TempSrc: Oral  Oral   SpO2: 97% 97% 98% 96%  Weight:      Height:       Weight change:  No intake or output data in the 24 hours ending 09/16/19 1932   Exam: Heart:: Regular rate and rhythm, S1S2 present or without murmur or extra heart sounds Lungs: normal, clear to auscultation and clear to auscultation and percussion Abdomen: soft, nontender, normal bowel sounds   Lab Results: CBC Latest Ref Rng & Units 09/16/2019 09/15/2019 09/14/2019  WBC 4.0 - 10.5 K/uL 14.5(H) 14.0(H) 17.9(H)  Hemoglobin 12.0 - 15.0 g/dL 11/14/2019 25.3 66.4  Hematocrit 36.0 - 46.0 % 33.4(L) 34.6(L) 37.9  Platelets 150 - 400 K/uL 133(L) 138(L) 173   CMP Latest Ref Rng & Units 09/16/2019 09/15/2019 09/14/2019  Glucose 70 - 99 mg/dL 11/14/2019) 83 96  BUN 6 - 20 mg/dL 14 13 10   Creatinine 0.44 - 1.00 mg/dL UNABLE TO REPORT DUE TO ICTERUS UNABLE TO REPORT DUE TO ICTERUS UNABLE TO REPORT DUE TO ICTERUS  Sodium 135 - 145 mmol/L 133(L) 133(L) 136  Potassium 3.5 - 5.1 mmol/L 3.6 3.4(L) 3.8  Chloride 98 - 111 mmol/L 105 103 104  CO2 22 - 32 mmol/L 23 22 24   Calcium 8.9 - 10.3 mg/dL 7.8(L) 7.6(L) 8.1(L)  Total Protein 6.5 - 8.1 g/dL 5.9(L) 5.6(L) 6.9  Total Bilirubin 0.3 - 1.2 mg/dL 25.1(HH) 23.2(HH) 27.9(HH)  Alkaline Phos 38 - 126 U/L 142(H) 143(H) 186(H)    AST 15 - 41 U/L 194(H) 174(H) 196(H)  ALT 0 - 44 U/L 78(H) 66(H) 58(H)    Micro Results: Recent Results (from the past 240 hour(s))  Urine Culture     Status: Abnormal   Collection Time: 09/11/19  8:04 PM   Specimen: Urine, Clean Catch  Result Value Ref Range Status   Specimen Description   Final    URINE, CLEAN CATCH Performed at [facility], [address], [place], [facility] [address]    Special Requests   Final    NONE Performed at [facility], [address] [place], [facility] [address]    Culture (A)  Final    <10,000 COLONIES/mL INSIGNIFICANT GROWTH Performed at [facility], [address] [place], [facility] [address]    Report Status 09/13/2019 FINAL  Final  SARS Coronavirus 2 by RT PCR (hospital order, performed in [facility] hospital lab) Nasopharyngeal Nasopharyngeal Swab     Status: None   Collection Time: 09/11/19 11:38 PM   Specimen: Nasopharyngeal Swab  Result Value Ref Range Status   SARS Coronavirus 2 NEGATIVE NEGATIVE Final    Comment: (NOTE) SARS-CoV-2 target nucleic acids are NOT DETECTED. The SARS-CoV-2 RNA is generally detectable in upper and lower respiratory  specimens during the acute phase of infection. The lowest concentration of SARS-CoV-2 viral copies this assay can detect is 250 copies / mL. A negative result does not preclude SARS-CoV-2 infection and should not be used as the sole basis for treatment or other patient management decisions.  A negative result may occur with improper specimen collection / handling, submission of specimen other than nasopharyngeal swab, presence of viral mutation(s) within the areas targeted by this assay, and inadequate number of viral copies (<250 copies / mL). A negative result must be combined with clinical observations, patient history, and epidemiological information. Fact Sheet for Patients:   BoilerBrush.com.cy Fact Sheet for Healthcare  Providers: https://pope.com/ This test is not yet approved or cleared  by the [place] FDA and has been authorized for detection and/or diagnosis of SARS-CoV-2 by FDA under an Emergency Use Authorization (EUA).  This EUA will remain in effect (meaning this test can be used) for the duration of the COVID-19 declaration under Section 564(b)(1) of the Act, 21 U.S.C. section 360bbb-3(b)(1), unless the authorization is terminated or revoked sooner. Performed at [facility], [address] [place], [place] [address]    Studies/Results: MR 3D Recon At Scanner  Result Date: 09/15/2019 CLINICAL DATA:  Liver failure, jaundice, alcohol abuse EXAM: MRI ABDOMEN WITHOUT AND WITH CONTRAST (INCLUDING MRCP) TECHNIQUE: Multiplanar multisequence MR imaging of the abdomen was performed both before and after the administration of intravenous contrast. Heavily T2-weighted images of the biliary and pancreatic ducts were obtained, and three-dimensional MRCP images were rendered by post processing. CONTRAST:  30mL GADAVIST GADOBUTROL 1 MMOL/ML IV SOLN COMPARISON:  CT abdomen pelvis, 09/11/2019 FINDINGS: Examination is generally somewhat limited by extensive breath motion artifact throughout. Lower chest: No acute findings.  Bilateral breast implants. Hepatobiliary: Hepatic steatosis and heterogeneous, fibrotic enhancement. No mass or other parenchymal abnormality identified. There is pericholecystic fluid and mild gallbladder wall thickening, nonspecific in the setting of ascites. Pancreas: No mass, inflammatory changes, or other parenchymal abnormality identified. No pancreatic ductal dilatation, however the pancreatic duct is poorly visualized due to motion artifact. Spleen:  At the upper limit of normal size. Adrenals/Urinary Tract: No masses identified. No evidence of hydronephrosis. Stomach/Bowel: Visualized portions within the abdomen are unremarkable. Vascular/Lymphatic: No  pathologically enlarged lymph nodes identified. No abdominal aortic aneurysm demonstrated. Other: Small volume ascites throughout the abdomen. Small, fat and fluid containing midline epigastric hernia, partially imaged. Musculoskeletal: No suspicious bone lesions identified. IMPRESSION: 1. Examination is generally limited by extensive breath motion artifact throughout. Within this limitation, there is hepatic steatosis and heterogeneous, fibrotic enhancement suggesting cirrhosis, however without overt morphologic stigmata. There is no biliary ductal dilatation or other abnormality. 2. Small volume ascites throughout the abdomen. 3. There is pericholecystic fluid and mild gallbladder wall thickening, nonspecific in the setting of ascites. 4. Small, fat and fluid containing midline epigastric hernia, partially imaged. Electronically Signed   By: Lauralyn Primes M.D.   On: 09/15/2019 10:58   MR ABDOMEN MRCP W WO CONTAST  Result Date: 09/15/2019 CLINICAL DATA:  Liver failure, jaundice, alcohol abuse EXAM: MRI ABDOMEN WITHOUT AND WITH CONTRAST (INCLUDING MRCP) TECHNIQUE: Multiplanar multisequence MR imaging of the abdomen was performed both before and after the administration of intravenous contrast. Heavily T2-weighted images of the biliary and pancreatic ducts were obtained, and three-dimensional MRCP images were rendered by post processing. CONTRAST:  67mL GADAVIST GADOBUTROL 1 MMOL/ML IV SOLN COMPARISON:  CT abdomen pelvis, 09/11/2019 FINDINGS: Examination is generally somewhat limited by extensive breath motion artifact throughout.  Lower chest: No acute findings.  Bilateral breast implants. Hepatobiliary: Hepatic steatosis and heterogeneous, fibrotic enhancement. No mass or other parenchymal abnormality identified. There is pericholecystic fluid and mild gallbladder wall thickening, nonspecific in the setting of ascites. Pancreas: No mass, inflammatory changes, or other parenchymal abnormality identified. No  pancreatic ductal dilatation, however the pancreatic duct is poorly visualized due to motion artifact. Spleen:  At the upper limit of normal size. Adrenals/Urinary Tract: No masses identified. No evidence of hydronephrosis. Stomach/Bowel: Visualized portions within the abdomen are unremarkable. Vascular/Lymphatic: No pathologically enlarged lymph nodes identified. No abdominal aortic aneurysm demonstrated. Other: Small volume ascites throughout the abdomen. Small, fat and fluid containing midline epigastric hernia, partially imaged. Musculoskeletal: No suspicious bone lesions identified. IMPRESSION: 1. Examination is generally limited by extensive breath motion artifact throughout. Within this limitation, there is hepatic steatosis and heterogeneous, fibrotic enhancement suggesting cirrhosis, however without overt morphologic stigmata. There is no biliary ductal dilatation or other abnormality. 2. Small volume ascites throughout the abdomen. 3. There is pericholecystic fluid and mild gallbladder wall thickening, nonspecific in the setting of ascites. 4. Small, fat and fluid containing midline epigastric hernia, partially imaged. Electronically Signed   By: Eddie Candle M.D.   On: 09/15/2019 10:58   Medications: I have reviewed the patient's current medications. Scheduled Meds: . folic acid  1 mg Oral Daily  . hydrocortisone   Rectal TID  . multivitamin with minerals  1 tablet Oral Daily  . nicotine  21 mg Transdermal Daily  . prednisoLONE  40 mg Oral Daily  . senna  1 tablet Oral BID  . thiamine  100 mg Oral Daily   Continuous Infusions:  PRN Meds:.lactulose, ondansetron (ZOFRAN) IV, traMADol   Assessment: Principal Problem:   Alcoholic hepatitis with ascites Active Problems:   Jaundice   Acute lower UTI   Hypokalemia   Thrombocytopenia (HCC)   Alcohol dependence (HCC)   Anxiety, generalized   Marcia Strickland 54 y.o.  Admitted with alcoholic hepatitis. DF 41.8. Acute hepattiis panel  negative. Tbil 24.6. INR 1.6 . USG shows CBD 8 mm  Patient does have cirrhosis of liver MRCP with no evidence of choledocholithiasis Prednisolone commenced on 6/4 for acute alcoholic hepatitis  Plan:  Acute Alcoholic hepatitis: Continue prednisolone 40 mg daily, day 5 today Lille score is 0.9 which indicates that steroid is not effective Will wait for 1 more day to see if patient has any improvement in total bilirubin.  If not, will discontinue steroid tomorrow With unknown creatinine/renal function, do not recommend pentoxifylline as an alternative Switch to lactulose, discontinued rifaximin Continue multivitamin plus thiamine plus folate High-protein diet and strict low-sodium diet Discussed about overall prognosis with patient and she understands, cautioned her about symptoms of bleeding Complete abstinence from alcohol use Monitor LFTs weekly upon discharge Close follow-up with GI upon discharge    LOS: 4 days   Sherri Sear, MD 09/16/2019, 7:32 PM

## 2019-09-16 NOTE — Progress Notes (Signed)
PROGRESS NOTE    Marcia CokeSherry L Nadeau   ZOX:[id number]  DOB: April 22, 1965  PCP: Patient, No Pcp Per    DOA: 09/11/2019 LOS: 4   Brief Narrative   Marcia Strickland is a 54 y.o. female with no known past, has not seen a physician in about 10 years. She presented to the ED on 09/11/19 with progressive jaundice, poor appetite and generalized weakness.  This developed after having flu-like symptoms for a week or more.  No prior history of liver disease.  She reported drinking about 1 pint/week of vodka.  In the ED, hemodynamically stable, with jaundice and scleral icterus.  Labs notable for elevated PT, aPTT and INR, potassium 3.1, lipase 134, alkPhos 203, AST 244, ALT 51, Tbili 28.1, WBC 15.9k.  UA was positive for infection.  EtOH level 92.  CT abdomen/pelvis showed hepatic steatosis, portal hypertension with varices.  GI was consulted in the ED, recommended treating with steroids and admission.  Admitted to hospitalist service with GI following.     Assessment & Plan   Principal Problem:   Alcoholic hepatitis with ascites Active Problems:   Jaundice   Acute lower UTI   Hypokalemia   Thrombocytopenia (HCC)   Alcohol dependence (HCC)   Anxiety, generalized   Alcoholic hepatitis with ascites and jaundice - POA.  Initial LFT's showed Tbili 28.1, Alkphos 203, AST 244, ALT 51.  Lipase was 134.  Discriminate function was 41.8 on admission, qualifies for steroids.  Hepatitis panel was negative.  Ultrasound showed likely cirrhotic liver with increased hepatic echogenicity and slightly nodular hepatic margins, no masses, contracted gallbladder, mild dilation of CBD to 8 mm, small volume ascites. Pancreas was not well visualized. CT findings as above. 6/8: LFT's worsening somewhat today although overall in downward trend. Jaundice improving --GI following, appreciate their assitance --continue Prednisolone 40 mg daily - started 6/5 - check lillie score at day 5 (on 6/9) to decide whether to continue  steroids or stop - MRCP unremarkable for choledocholithiasis  --vitamin K given per GI --optimize nutrition status, high protein low sodium diet --complete alcohol cessation --continue rifaximin 550 mg BID --outpatient EGD to screen for varices  Acute lower UTI - ruled out.  Urine culture with insignificant growth.  Patient without urinary symptoms so stop antibiotics.  Monitor for symptoms.   Alcohol dependence - last drink Thursday 6/3 evening.  Presented with serum EtOH 92.  Long history of self-medicating for anxiety with alcohol.  Has had very low CIWA scores, not needing Ativan. --CIWA protocol with PRN Ativan --consider Librium if scores rising --consider Naltrexone for alcohol cravings in outpatient setting if needed, discussed with patient  Hypokalemia - POA with potassium 3.1.  Replaced.  Monitor and replace as needed.  Thrombocytopenia  - due to liver disease.  Monitor CBC.  Hypoalbuminemia - due to liver disease and maybe poor nutrition habits.  Monitor BP, edema, LFT's.    Leukocytosis - increasing due to steroids most likely, no fevers or other signs of infection.  Monitor CBC.  Monitor for infection signs/symptoms.  Anxiety, generalized - patient reports history of using alcohol to deal with anxiety and stress.  She reports tremors when sober that are provoked by stress and anxiety.  Has tried Lexapro in past, say just it made her sleep.  She is agreeable to trying another medication (SSRI).    Continuity of Care - Patient has no PCP.  TOC consulted.     Patient BMI: Body mass index is 34.75 kg/m.  DVT prophylaxis: SCD's  Diet:  Diet Orders (From admission, onward)    Start     Ordered   09/14/19 1352  Diet 2 gram sodium Room service appropriate? Yes; Fluid consistency: Thin  Diet effective now    Question Answer Comment  Room service appropriate? Yes   Fluid consistency: Thin      09/14/19 1351            Code Status: Full Code    Subjective 09/16/19     Overall improving. Still concerned about her liver - numbers somewhat trending up today than yesterday.  She would like to talk with GI  Disposition Plan & Communication   Status is: Inpatient  Remains inpatient appropriate because:Ongoing diagnostic testing needed not appropriate for outpatient work up, needs lilli score on day 5 of steroids (6/9) before decision for D/C per GI  Dispo:  Patient From: Home  Planned Disposition: Home  Expected discharge date: 1 to 2 days  Medically stable for discharge: No   Family Communication: discussed with patient. No family at bedside   Consults, Procedures, Significant Events   Consultants:   Gastroenterology  Procedures:   None  Antimicrobials:   Rocephin 6/4 >> 6/5 (urine culture with insignificant growth)   Objective   Vitals:   09/16/19 0357 09/16/19 0749 09/16/19 1200 09/16/19 1618  BP: 126/87 (!) 143/92 136/84 (!) 162/96  Pulse: 86 (!) 101 98 91  Resp: 20 18 17 14   Temp: 98.4 F (36.9 C) 98.2 F (36.8 C) 98.6 F (37 C) 98.1 F (36.7 C)  TempSrc: Oral  Oral   SpO2: 97% 97% 98% 96%  Weight:      Height:       No intake or output data in the 24 hours ending 09/16/19 1735 Filed Weights   09/11/19 1959  Weight: 86.2 kg    Physical Exam:  General exam: awake, alert, no acute distress HEENT: icteric sclera, moist mucus membranes, hearing grossly normal  Respiratory system: CTAB, no wheezes, rales or rhonchi, normal respiratory effort. Cardiovascular system: normal S1/S2, RRR, no pedal edema.   Gastrointestinal system: Somewhat distended but improved, nontender, +bowel sounds Central nervous system: A&O x4. no gross focal neurologic deficits, normal speech Extremities: moves all, normal tone Skin: jaundice, dry, intact, normal temperature Psychiatry: normal mood, congruent affect, judgement and insight appear normal  Labs   Data Reviewed: I have personally reviewed following labs and imaging  studies  CBC: Recent Labs  Lab 09/11/19 2004 09/11/19 2004 09/12/19 11/12/19 09/12/19 1917 09/14/19 0612 09/15/19 0614 09/16/19 0344  WBC 15.9*   < > 11.0* 12.7* 17.9* 14.0* 14.5*  NEUTROABS 10.7*  --   --  9.7* 13.2* 10.6* 11.4*  HGB 13.4   < > 12.3 11.8* 13.8 12.6 12.3  HCT 36.0   < > 33.3* 31.8* 37.9 34.6* 33.4*  MCV 95.2   < > 95.7 94.9 96.9 97.2 97.1  PLT 138*   < > 129* 131* 173 138* 133*   < > = values in this interval not displayed.   Basic Metabolic Panel: Recent Labs  Lab 09/12/19 0538 09/13/19 0412 09/14/19 0612 09/15/19 0614 09/16/19 0344  NA 131* 136 136 133* 133*  K 3.3* 4.0 3.8 3.4* 3.6  CL 97* 106 104 103 105  CO2 24 25 24 22 23   GLUCOSE 127* 151* 96 83 112*  BUN 8 9 10 13 14   CREATININE UNABLE TO REPORT DUE TO ICTERUS UNABLE TO RESULT DUE TO ICTERUS UNABLE TO  REPORT DUE TO ICTERUS UNABLE TO REPORT DUE TO ICTERUS  UNABLE TO REPORT DUE TO ICTERUS  CALCIUM 7.7* 7.8* 8.1* 7.6* 7.8*  MG 1.7 1.7  --   --   --    GFR: CrCl cannot be calculated (This lab value cannot be used to calculate CrCl because it is not a number:  UNABLE TO REPORT DUE TO ICTERUS). Liver Function Tests: Recent Labs  Lab 09/12/19 0538 09/13/19 0412 09/14/19 0612 09/15/19 0614 09/16/19 0344  AST 212* 177* 196* 174* 194*  ALT 49* 44 58* 66* 78*  ALKPHOS 166* 167* 186* 143* 142*  BILITOT 26.4* 24.6* 27.9* 23.2* 25.1*  PROT 6.6 6.2* 6.9 5.6* 5.9*  ALBUMIN 2.3* 2.0* 2.4* 1.9* 1.9*   Recent Labs  Lab 09/11/19 2004 09/14/19 0612  LIPASE 134* 132*   No results for input(s): AMMONIA in the last 168 hours. Coagulation Profile: Recent Labs  Lab 09/11/19 2231 09/14/19 0612 09/15/19 0614 09/16/19 0344  INR 1.6* 1.7* 1.7* 1.7*   Cardiac Enzymes: No results for input(s): CKTOTAL, CKMB, CKMBINDEX, TROPONINI in the last 168 hours. BNP (last 3 results) No results for input(s): PROBNP in the last 8760 hours. HbA1C: No results for input(s): HGBA1C in the last 72 hours. CBG: No  results for input(s): GLUCAP in the last 168 hours. Lipid Profile: No results for input(s): CHOL, HDL, LDLCALC, TRIG, CHOLHDL, LDLDIRECT in the last 72 hours. Thyroid Function Tests: No results for input(s): TSH, T4TOTAL, FREET4, T3FREE, THYROIDAB in the last 72 hours. Anemia Panel: No results for input(s): VITAMINB12, FOLATE, FERRITIN, TIBC, IRON, RETICCTPCT in the last 72 hours. Sepsis Labs: No results for input(s): PROCALCITON, LATICACIDVEN in the last 168 hours.  Recent Results (from the past 240 hour(s))  Urine Culture     Status: Abnormal   Collection Time: 09/11/19  8:04 PM   Specimen: Urine, Clean Catch  Result Value Ref Range Status   Specimen Description   Final    URINE, CLEAN CATCH Performed at [facility], [address] [place], [place] [address]    Special Requests   Final    NONE Performed at [facility], [address] [place], [place] [address]    Culture (A)  Final    <10,000 COLONIES/mL INSIGNIFICANT GROWTH Performed at [facility], [address] [place], [place] [address]    Report Status 09/13/2019 FINAL  Final  SARS Coronavirus 2 by RT PCR (hospital order, performed in [facility] hospital lab) Nasopharyngeal Nasopharyngeal Swab     Status: None   Collection Time: 09/11/19 11:38 PM   Specimen: Nasopharyngeal Swab  Result Value Ref Range Status   SARS Coronavirus 2 NEGATIVE NEGATIVE Final    Comment: (NOTE) SARS-CoV-2 target nucleic acids are NOT DETECTED. The SARS-CoV-2 RNA is generally detectable in upper and lower respiratory specimens during the acute phase of infection. The lowest concentration of SARS-CoV-2 viral copies this assay can detect is 250 copies / mL. A negative result does not preclude SARS-CoV-2 infection and should not be used as the sole basis for treatment or other patient management decisions.  A negative result may occur with improper specimen collection / handling, submission of specimen  other than nasopharyngeal swab, presence of viral mutation(s) within the areas targeted by this assay, and inadequate number of viral copies (<250 copies / mL). A negative result must be combined with clinical observations, patient history, and epidemiological information. Fact Sheet for Patients:   BoilerBrush.com.cy Fact Sheet for Healthcare Providers: https://pope.com/ This  test is not yet approved or cleared  by the [place] and has been authorized for detection and/or diagnosis of SARS-CoV-2 by FDA under an Emergency Use Authorization (EUA).  This EUA will remain in effect (meaning this test can be used) for the duration of the COVID-19 declaration under Section 564(b)(1) of the Act, 21 U.S.C. section 360bbb-3(b)(1), unless the authorization is terminated or revoked sooner. Performed at [facility], [address] [place], [place] [address]       Imaging Studies   MR 3D Recon At Scanner  Result Date: 09/15/2019 CLINICAL DATA:  Liver failure, jaundice, alcohol abuse EXAM: MRI ABDOMEN WITHOUT AND WITH CONTRAST (INCLUDING MRCP) TECHNIQUE: Multiplanar multisequence MR imaging of the abdomen was performed both before and after the administration of intravenous contrast. Heavily T2-weighted images of the biliary and pancreatic ducts were obtained, and three-dimensional MRCP images were rendered by post processing. CONTRAST:  75mL GADAVIST GADOBUTROL 1 MMOL/ML IV SOLN COMPARISON:  CT abdomen pelvis, 09/11/2019 FINDINGS: Examination is generally somewhat limited by extensive breath motion artifact throughout. Lower chest: No acute findings.  Bilateral breast implants. Hepatobiliary: Hepatic steatosis and heterogeneous, fibrotic enhancement. No mass or other parenchymal abnormality identified. There is pericholecystic fluid and mild gallbladder wall thickening, nonspecific in the setting of ascites. Pancreas: No mass,  inflammatory changes, or other parenchymal abnormality identified. No pancreatic ductal dilatation, however the pancreatic duct is poorly visualized due to motion artifact. Spleen:  At the upper limit of normal size. Adrenals/Urinary Tract: No masses identified. No evidence of hydronephrosis. Stomach/Bowel: Visualized portions within the abdomen are unremarkable. Vascular/Lymphatic: No pathologically enlarged lymph nodes identified. No abdominal aortic aneurysm demonstrated. Other: Small volume ascites throughout the abdomen. Small, fat and fluid containing midline epigastric hernia, partially imaged. Musculoskeletal: No suspicious bone lesions identified. IMPRESSION: 1. Examination is generally limited by extensive breath motion artifact throughout. Within this limitation, there is hepatic steatosis and heterogeneous, fibrotic enhancement suggesting cirrhosis, however without overt morphologic stigmata. There is no biliary ductal dilatation or other abnormality. 2. Small volume ascites throughout the abdomen. 3. There is pericholecystic fluid and mild gallbladder wall thickening, nonspecific in the setting of ascites. 4. Small, fat and fluid containing midline epigastric hernia, partially imaged. Electronically Signed   By: Eddie Candle M.D.   On: 09/15/2019 10:58   MR ABDOMEN MRCP W WO CONTAST  Result Date: 09/15/2019 CLINICAL DATA:  Liver failure, jaundice, alcohol abuse EXAM: MRI ABDOMEN WITHOUT AND WITH CONTRAST (INCLUDING MRCP) TECHNIQUE: Multiplanar multisequence MR imaging of the abdomen was performed both before and after the administration of intravenous contrast. Heavily T2-weighted images of the biliary and pancreatic ducts were obtained, and three-dimensional MRCP images were rendered by post processing. CONTRAST:  29mL GADAVIST GADOBUTROL 1 MMOL/ML IV SOLN COMPARISON:  CT abdomen pelvis, 09/11/2019 FINDINGS: Examination is generally somewhat limited by extensive breath motion artifact throughout.  Lower chest: No acute findings.  Bilateral breast implants. Hepatobiliary: Hepatic steatosis and heterogeneous, fibrotic enhancement. No mass or other parenchymal abnormality identified. There is pericholecystic fluid and mild gallbladder wall thickening, nonspecific in the setting of ascites. Pancreas: No mass, inflammatory changes, or other parenchymal abnormality identified. No pancreatic ductal dilatation, however the pancreatic duct is poorly visualized due to motion artifact. Spleen:  At the upper limit of normal size. Adrenals/Urinary Tract: No masses identified. No evidence of hydronephrosis. Stomach/Bowel: Visualized portions within the abdomen are unremarkable. Vascular/Lymphatic: No pathologically enlarged lymph nodes identified. No abdominal aortic aneurysm demonstrated. Other: Small volume ascites throughout the abdomen. Small,  fat and fluid containing midline epigastric hernia, partially imaged. Musculoskeletal: No suspicious bone lesions identified. IMPRESSION: 1. Examination is generally limited by extensive breath motion artifact throughout. Within this limitation, there is hepatic steatosis and heterogeneous, fibrotic enhancement suggesting cirrhosis, however without overt morphologic stigmata. There is no biliary ductal dilatation or other abnormality. 2. Small volume ascites throughout the abdomen. 3. There is pericholecystic fluid and mild gallbladder wall thickening, nonspecific in the setting of ascites. 4. Small, fat and fluid containing midline epigastric hernia, partially imaged. Electronically Signed   By: Lauralyn Primes M.D.   On: 09/15/2019 10:58     Medications   Scheduled Meds: . folic acid  1 mg Oral Daily  . hydrocortisone   Rectal TID  . multivitamin with minerals  1 tablet Oral Daily  . nicotine  21 mg Transdermal Daily  . prednisoLONE  40 mg Oral Daily  . rifaximin  550 mg Oral BID  . senna  1 tablet Oral BID  . thiamine  100 mg Oral Daily   Continuous  Infusions:      LOS: 4 days    Time spent: 30 minutes    Delfino Lovett, MD [facility]  09/16/2019, 5:35 PM    If 7PM-7AM, please contact night-coverage. How to contact the [facility] Attending or Consulting provider 7A - 7P or covering provider during after hours 7P -7A, for this patient?    1. Check the care team in [facility] and look for a) attending/consulting [facility] provider listed and b) the [facility] team listed 2. Log into [email or web address] and use [facility]'s universal password to access. If you do not have the password, please contact the hospital operator. 3. Locate the [facility] provider you are looking for under [facility] and page to a number that you can be directly reached. 4. If you still have difficulty reaching the provider, please page the [facility] (Director on Call) for the Hospitalists listed on amion for assistance.

## 2019-09-17 DIAGNOSIS — K7010 Alcoholic hepatitis without ascites: Secondary | ICD-10-CM

## 2019-09-17 DIAGNOSIS — R7989 Other specified abnormal findings of blood chemistry: Secondary | ICD-10-CM

## 2019-09-17 LAB — MISC LABCORP TEST (SEND OUT): Labcorp test code: 121251

## 2019-09-17 LAB — COMPREHENSIVE METABOLIC PANEL
ALT: 78 U/L — ABNORMAL HIGH (ref 0–44)
ALT: 101 U/L — ABNORMAL HIGH (ref 0–44)
AST: 194 U/L — ABNORMAL HIGH (ref 15–41)
AST: 216 U/L — ABNORMAL HIGH (ref 15–41)
Albumin: 1.9 g/dL — ABNORMAL LOW (ref 3.5–5.0)
Albumin: 2.2 g/dL — ABNORMAL LOW (ref 3.5–5.0)
Alkaline Phosphatase: 142 U/L — ABNORMAL HIGH (ref 38–126)
Alkaline Phosphatase: 153 U/L — ABNORMAL HIGH (ref 38–126)
Anion gap: 5 (ref 5–15)
Anion gap: 10 (ref 5–15)
BUN: 14 mg/dL (ref 6–20)
BUN: 13 mg/dL (ref 6–20)
CO2: 23 mmol/L (ref 22–32)
CO2: 21 mmol/L — ABNORMAL LOW (ref 22–32)
Calcium: 7.8 mg/dL — ABNORMAL LOW (ref 8.9–10.3)
Calcium: 8 mg/dL — ABNORMAL LOW (ref 8.9–10.3)
Chloride: 105 mmol/L (ref 98–111)
Chloride: 103 mmol/L (ref 98–111)
Creatinine, Ser: UNDETERMINED mg/dL (ref 0.44–1.00)
Creatinine, Ser: UNDETERMINED mg/dL (ref 0.44–1.00)
Glucose, Bld: 112 mg/dL — ABNORMAL HIGH (ref 70–99)
Glucose, Bld: 87 mg/dL (ref 70–99)
Potassium: 3.6 mmol/L (ref 3.5–5.1)
Potassium: 3.7 mmol/L (ref 3.5–5.1)
Sodium: 133 mmol/L — ABNORMAL LOW (ref 135–145)
Sodium: 134 mmol/L — ABNORMAL LOW (ref 135–145)
Total Bilirubin: 25.1 mg/dL (ref 0.3–1.2)
Total Bilirubin: 27 mg/dL (ref 0.3–1.2)
Total Protein: 5.9 g/dL — ABNORMAL LOW (ref 6.5–8.1)
Total Protein: 6.7 g/dL (ref 6.5–8.1)

## 2019-09-17 LAB — CBC
HCT: 38.4 % (ref 36.0–46.0)
Hemoglobin: 13.7 g/dL (ref 12.0–15.0)
MCH: 35.9 pg — ABNORMAL HIGH (ref 26.0–34.0)
MCHC: 35.7 g/dL (ref 30.0–36.0)
MCV: 100.5 fL — ABNORMAL HIGH (ref 80.0–100.0)
Platelets: 153 10*3/uL (ref 150–400)
RBC: 3.82 MIL/uL — ABNORMAL LOW (ref 3.87–5.11)
RDW: 19.9 % — ABNORMAL HIGH (ref 11.5–15.5)
WBC: 19.1 10*3/uL — ABNORMAL HIGH (ref 4.0–10.5)
nRBC: 0 % (ref 0.0–0.2)

## 2019-09-17 MED ORDER — PANTOPRAZOLE SODIUM 40 MG PO TBEC: 40.0000 mg | DELAYED_RELEASE_TABLET | Freq: Two times a day (BID) | ORAL | Status: DC

## 2019-09-17 MED ORDER — ALBUMIN HUMAN 5 % IV SOLN
25.0000 g | Freq: Three times a day (TID) | INTRAVENOUS | Status: DC
  Filled 2019-09-17 (×2): qty 500

## 2019-09-17 MED ORDER — ALBUMIN NICU 5% IV SOLUTION
75.0000 g | INTRAVENOUS | Status: DC
  Filled 2019-09-17 (×2): qty 1500

## 2019-09-17 MED ORDER — THIAMINE HCL 100 MG PO TABS: 100.0000 mg | ORAL_TABLET | Freq: Every day | ORAL | 0 refills | Status: DC

## 2019-09-17 MED ORDER — PANTOPRAZOLE SODIUM 40 MG PO TBEC: 40.0000 mg | DELAYED_RELEASE_TABLET | Freq: Two times a day (BID) | ORAL | 0 refills | Status: DC

## 2019-09-17 MED ORDER — ADULT MULTIVITAMIN W/MINERALS CH: 1.0000 | ORAL_TABLET | Freq: Every day | ORAL | 0 refills | Status: DC

## 2019-09-17 MED ORDER — RIFAXIMIN 550 MG PO TABS
550.0000 mg | ORAL_TABLET | Freq: Two times a day (BID) | ORAL | Status: DC
  Filled 2019-09-17: qty 1

## 2019-09-17 MED ORDER — GABAPENTIN 600 MG PO TABS
300.0000 mg | ORAL_TABLET | Freq: Every day | ORAL | Status: DC
  Filled 2019-09-17: qty 1

## 2019-09-17 MED ORDER — FLUOXETINE HCL 10 MG PO CAPS
10.0000 mg | ORAL_CAPSULE | Freq: Every day | ORAL | Status: DC
  Administered 2019-09-17: 11:00:00 10 mg via ORAL
  Filled 2019-09-17: qty 1

## 2019-09-17 MED ORDER — LACTULOSE 10 GM/15ML PO SOLN: 20.0000 g | Freq: Two times a day (BID) | ORAL | 0 refills | Status: DC | PRN

## 2019-09-17 MED ORDER — GABAPENTIN 600 MG PO TABS: 300.0000 mg | ORAL_TABLET | Freq: Every day | ORAL | 0 refills | Status: DC

## 2019-09-17 MED ORDER — ENSURE ENLIVE PO LIQD: 237.0000 mL | ORAL | 12 refills | Status: DC

## 2019-09-17 MED ORDER — ENSURE ENLIVE PO LIQD: 237.0000 mL | ORAL | Status: DC

## 2019-09-17 MED ORDER — ALBUMIN HUMAN 5 % IV SOLN
75.0000 g | Freq: Once | INTRAVENOUS | Status: AC
  Administered 2019-09-17: 11:00:00 75 g via INTRAVENOUS
  Filled 2019-09-17: qty 1500

## 2019-09-17 MED ORDER — FOLIC ACID 1 MG PO TABS: 1.0000 mg | ORAL_TABLET | Freq: Every day | ORAL | 0 refills | Status: AC

## 2019-09-17 MED ORDER — RIFAXIMIN 550 MG PO TABS: 550.0000 mg | ORAL_TABLET | Freq: Two times a day (BID) | ORAL | 0 refills | Status: DC

## 2019-09-17 NOTE — Progress Notes (Addendum)
Initial Nutrition Assessment  DOCUMENTATION CODES:   Obesity unspecified  INTERVENTION:  Provide Ensure Enlive po once daily, each supplement provides 350 kcal and 20 grams of protein. Patient prefers chocolate or strawberry.  Continue MVI daily, thiamine 300 mg daily, folic acid 1 mg daily.  Reviewed "Cirrhosis Nutrition Therapy" and "Protein Content of Foods" handouts from the Academy of Nutrition and Dietetics. Encouraged intake of small, frequent meals throughout the day. Discussed recommended sodium restriction. Reviewed which foods are high in sodium that should be limited/avoided. Discussed specific alternatives that are lower in sodium and ways to flavor food without using sodium. Encouraged a high protein diet. Reviewed foods that are high in protein and encouraged patient to choose these foods at each meal. Patient is limited in protein options she can have due to dietary restrictions so reviewed options she is able to have and provided daily protein goal. Teach back method used. Expect good compliance.  NUTRITION DIAGNOSIS:   Increased nutrient needs related to catabolic illness(cirrhosis) as evidenced by estimated needs.  GOAL:   Patient will meet greater than or equal to 90% of their needs  MONITOR:   PO intake, Supplement acceptance, Labs, Weight trends, I & O's  REASON FOR ASSESSMENT:   Consult Diet education(cirrhosis, low sodium, high protein)  ASSESSMENT:   54 year old female with no known past medical hx admitted with alcoholic hepatitis with ascites and jaundice, alcohol dependence, acute renal failure.   -Per GI note patient with cirrhosis.  Met with patient at bedside. She had a decreased appetite for several days prior to admission and earlier in admission. She reports for the last 1.5 years she has also been eating less in general due to stress at work. She reports her appetite is improved now and she is eating better at meals. She has been following a  low-sodium, high-protein diet here in the hospital and reports she is now eating about 75% of her meals. 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 She also does not eat soy products. Encouraged use of a protein shake to help meet increased protein needs. Patient tried Ensure Max Protein and did not enjoy it. She is willing to try Ensure Enlive to see if she likes it better.   Patient reports she does not believe she has lost any weight. Her UBW is 190 lbs though she does endorse it is difficult to tell due to ascites.  Medications reviewed and include: folic acid 1 mg daily, MVI daily, nicotine patch, prednisolone 40 mg daily, senna 1 tablet BID, thiamine 100 mg daily.  Labs reviewed: Sodium 134, CO2 21.  Patient does not meet criteria for malnutrition at this time.  NUTRITION - FOCUSED PHYSICAL EXAM:    Most Recent Value  Orbital Region  No depletion  Upper Arm Region  No depletion  Thoracic and Lumbar Region  No depletion  Buccal Region  No depletion  Temple Region  No depletion  Clavicle Bone Region  No depletion  Clavicle and Acromion Bone Region  No depletion  Scapular Bone Region  No depletion  Dorsal Hand  No depletion  Patellar Region  No depletion  Anterior Thigh Region  No depletion  Posterior Calf Region  No depletion  Edema (RD Assessment)  None  Hair  Reviewed  Eyes  Reviewed  Mouth  Reviewed  Skin  Reviewed  Nails  Reviewed     Diet Order:   Diet Order  Diet 2 gram sodium Room service appropriate? Yes; Fluid consistency: Thin  Diet effective now             EDUCATION NEEDS:   Education needs have been addressed  Skin:  Skin Assessment: Reviewed RN Assessment  Last BM:  09/14/2019  Height:   Ht Readings from Last 1 Encounters:  09/11/19 5' 2"  (1.575 m)   Weight:   Wt Readings from Last 1 Encounters:  09/11/19 86.2 kg   BMI:  Body mass index is 34.75 kg/m.  Estimated  Nutritional Needs:   Kcal:  1800-2000  Protein:  90-100 grams  Fluid:  per MD  Jacklynn Barnacle, MS, RD, LDN Pager number available on Amion

## 2019-09-17 NOTE — Discharge Instructions (Signed)
Alcoholic Hepatitis  Alcoholic hepatitis is liver inflammation that is caused by drinking a lot of alcohol over a long period of time. This inflammation decreases the liver's ability to function normally. This condition requires you to stop drinking alcohol permanently to prevent further damage. What are the causes? Alcoholic hepatitis is caused by long-term (chronic) heavy alcohol use. The liver filters alcohol out of the bloodstream. When alcohol gets divided into small particles (broken down) in the liver, substances are produced that can damage liver cells. This causes destruction of liver cells and inflammation. What increases the risk? The following factors may make you more likely to develop this condition:  Regularly drinking large amounts of alcohol, especially in a short amount of time (binge drinking).  Drinking heavily for years.  Being female.  Being obese.  Having had a hepatitis infection in the past.  Having a liver problem that you were born with (genetic liver disease).  Having a lack (deficiency) of certain nutrients, such as folate or thiamine.  Having a parent or sibling who has alcoholic hepatitis. What are the signs or symptoms? Symptoms of this condition include:  Pain and swelling in the abdomen.  Loss of appetite.  Losing weight without trying.  Nausea and vomiting.  Diarrhea.  Fever.  Fatigue.  Yellowing of the skin and the whites of the eyes (jaundice).  Veins that you can see ("spider veins"), especially in the abdomen.  Bleeding easily, such as excessive bleeding from a minor cut.  Itching.  Trouble thinking clearly.  Memory problems.  Mood changes.  Confusion. How is this diagnosed? This condition may be diagnosed with:  A physical exam and a review of medical history.  Blood tests to check liver function.  Tests that create detailed images of the body. These may include: ? A liver ultrasound. ? CT scan. ? MRI.  A  liver biopsy. For this test, a small sample of liver tissue is removed and checked for signs of liver damage. How is this treated? The most important part of treatment is to stop drinking alcohol. If you are addicted to alcohol, your health care provider will help you make a plan to quit. This plan may involve:  Taking medicine to decrease unpleasant symptoms that are caused by stopping or decreasing alcohol use (withdrawal symptoms).  Entering a treatment program to help you stop drinking.  Joining a support group. Treatment for alcoholic hepatitis may also include:  Steroid medicines to reduce inflammation.  Nutritional therapy. Your health care provider or a diet and nutrition specialist (dietitian) may recommend: ? Eating a healthy diet. ? Eating specific foods that contain vitamins and minerals to help you maintain nutrient levels in your body. ? Taking vitamins and dietary supplements to make sure you maintain nutrient levels in your body.  Receiving a donated liver (liver transplant). This is only done in very severe cases, and only for people who have completely stopped drinking and can commit to never drinking alcohol again. Follow these instructions at home:   Do not drink alcohol. Follow your treatment plan, and work with your health care provider as needed.  Consider joining an alcohol support group. These groups can provide emotional support and guidance.  Take over-the-counter and prescription medicines only as told by your health care provider. These include vitamins and supplements.  Do not use medicines or eat foods that contain alcohol unless told by your health care provider.  Follow instructions from your health care provider or dietitian about nutritional therapy.    Keep all follow-up visits as told by your health care provider. This is important. Contact a health care provider if:  You have a fever.  You have a decreased appetite.  You have flu-like  symptoms such as fatigue, weakness, or muscle aches.  You have nausea or vomiting.  You bruise easily.  Your urine is very dark.  You develop new pain in your abdomen. Get help right away if:  You vomit blood.  You develop jaundice.  You have severely itchy skin.  Your legs swell.  Your abdomen suddenly swells.  You have stools that are black, tar-like, or bloody.  You bleed easily, such as excessive bleeding from a minor cut.  You are confused or not thinking clearly.  You have a seizure. Summary  Alcoholic hepatitis is liver inflammation that is caused by drinking a lot of alcohol over a long period of time.  Alcoholic hepatitis is diagnosed with blood tests that check liver function.  The most important part of treatment is to stop drinking alcohol. Follow your treatment plan, and work with your health care provider as needed. This information is not intended to replace advice given to you by your health care provider. Make sure you discuss any questions you have with your health care provider. Document Revised: 07/16/2018 Document Reviewed: 12/07/2016 Elsevier Patient Education  2020 Elsevier Inc.  

## 2019-09-17 NOTE — Progress Notes (Signed)
Marcia Strickland , MD [address], [place], [place], [place], [address] [address], [place], [place], [place], [address] Phone: [phone]  Fax: [phone]   Subjective: Denies any complaints, sitting in chair, reading book.  Husband is bedside.  Patient wants to go home.  On IV albumin, reports having bowel movements today Patient is seen by dietitian  Objective: Vital signs in last 24 hours: Vitals:   09/16/19 2333 09/17/19 0400 09/17/19 0748 09/17/19 1334  BP: 140/88 140/75 132/78 135/80  Pulse: 88 84 100 86  Resp: 18 16 18 18   Temp: 98.7 F (37.1 C) 98.3 F (36.8 C) 98.4 F (36.9 C) 98.2 F (36.8 C)  TempSrc:  Oral Oral Oral  SpO2: 98% 96% 95% 94%  Weight:      Height:       Weight change:  No intake or output data in the 24 hours ending 09/17/19 1454   Exam: Heart:: Regular rate and rhythm, S1S2 present or without murmur or extra heart sounds Lungs: normal, clear to auscultation and clear to auscultation and percussion Abdomen: soft, nontender, normal bowel sounds   Lab Results: CBC Latest Ref Rng & Units 09/17/2019 09/16/2019 09/15/2019  WBC 4.0 - 10.5 K/uL 19.1(H) 14.5(H) 14.0(H)  Hemoglobin 12.0 - 15.0 g/dL 13.7 12.3 12.6  Hematocrit 36.0 - 46.0 % 38.4 33.4(L) 34.6(L)  Platelets 150 - 400 K/uL 153 133(L) 138(L)   CMP Latest Ref Rng & Units 09/17/2019 09/16/2019 09/15/2019  Glucose 70 - 99 mg/dL 87 112(H) 83  BUN 6 - 20 mg/dL 13 14 13   Creatinine 0.44 - 1.00 mg/dL UNABLE TO REPORT DUE TO ICTERUS UNABLE TO REPORT DUE TO ICTERUS UNABLE TO REPORT DUE TO ICTERUS  Sodium 135 - 145 mmol/L 134(L) 133(L) 133(L)  Potassium 3.5 - 5.1 mmol/L 3.7 3.6 3.4(L)  Chloride 98 - 111 mmol/L 103 105 103  CO2 22 - 32 mmol/L 21(L) 23 22  Calcium 8.9 - 10.3 mg/dL 8.0(L) 7.8(L) 7.6(L)  Total Protein 6.5 - 8.1 g/dL 6.7 5.9(L) 5.6(L)  Total Bilirubin 0.3 - 1.2 mg/dL 27.0(HH) 25.1(HH) 23.2(HH)  Alkaline Phos 38 - 126 U/L 153(H) 142(H) 143(H)  AST 15 - 41 U/L 216(H) 194(H) 174(H)  ALT  0 - 44 U/L 101(H) 78(H) 66(H)    Micro Results: Recent Results (from the past 240 hour(s))  Urine Culture     Status: Abnormal   Collection Time: 09/11/19  8:04 PM   Specimen: Urine, Clean Catch  Result Value Ref Range Status   Specimen Description   Final    URINE, CLEAN CATCH Performed at [facility], [address] [place], [place] [address]    Special Requests   Final    NONE Performed at [facility], [address] [place], [place] [address]    Culture (A)  Final    <10,000 COLONIES/mL INSIGNIFICANT GROWTH Performed at [facility], [place] [address], [place], [place] [address]    Report Status 09/13/2019 FINAL  Final  SARS Coronavirus 2 by RT PCR (hospital order, performed in [facility] hospital lab) Nasopharyngeal Nasopharyngeal Swab     Status: None   Collection Time: 09/11/19 11:38 PM   Specimen: Nasopharyngeal Swab  Result Value Ref Range Status   SARS Coronavirus 2 NEGATIVE NEGATIVE Final    Comment: (NOTE) SARS-CoV-2 target nucleic acids are NOT DETECTED. The SARS-CoV-2 RNA is generally detectable in upper and lower respiratory specimens during the acute phase of infection. The lowest concentration of SARS-CoV-2 viral  copies this assay can detect is 250 copies / mL. A negative result does not preclude SARS-CoV-2 infection and should not be used as the sole basis for treatment or other patient management decisions.  A negative result may occur with improper specimen collection / handling, submission of specimen other than nasopharyngeal swab, presence of viral mutation(s) within the areas targeted by this assay, and inadequate number of viral copies (<250 copies / mL). A negative result must be combined with clinical observations, patient history, and epidemiological information. Fact Sheet for Patients:   BoilerBrush.com.cy Fact Sheet for Healthcare Providers: https://pope.com/ This test  is not yet approved or cleared  by the [place] FDA and has been authorized for detection and/or diagnosis of SARS-CoV-2 by FDA under an Emergency Use Authorization (EUA).  This EUA will remain in effect (meaning this test can be used) for the duration of the COVID-19 declaration under Section 564(b)(1) of the Act, 21 U.S.C. section 360bbb-3(b)(1), unless the authorization is terminated or revoked sooner. Performed at [facility], [address], [place], [place] [address]    Studies/Results: No results found. Medications: I have reviewed the patient's current medications. Scheduled Meds: . feeding supplement (ENSURE ENLIVE)  237 mL Oral Q24H  . FLUoxetine  10 mg Oral Daily  . folic acid  1 mg Oral Daily  . gabapentin  300 mg Oral Daily  . hydrocortisone   Rectal TID  . multivitamin with minerals  1 tablet Oral Daily  . nicotine  21 mg Transdermal Daily  . pantoprazole  40 mg Oral BID AC  . rifaximin  550 mg Oral BID  . thiamine  100 mg Oral Daily   Continuous Infusions:  PRN Meds:.lactulose, ondansetron (ZOFRAN) IV, traMADol   Assessment: Principal Problem:   Alcoholic hepatitis with ascites Active Problems:   Jaundice   Acute lower UTI   Hypokalemia   Thrombocytopenia (HCC)   Alcohol dependence (HCC)   Anxiety, generalized   Marcia Strickland 54 y.o.  Admitted with alcoholic hepatitis. DF 41.8. Acute hepattiis panel negative. Tbil 24.6. INR 1.6 . USG shows CBD 8 mm  Patient does have cirrhosis of liver, with no evidence of portal hypertension based on imaging MRCP with no evidence of choledocholithiasis Prednisolone commenced on 6/4 for acute alcoholic hepatitis, discontinued on 6/9 due to high Lille score, 0.9  Plan:  Acute Alcoholic hepatitis: Discontinued prednisolone No evidence of AKI, kidney function is normal Recommend rifaximin 550 mg twice daily upon discharge Lactulose 20 g as needed 2-3 times daily Continue multivitamin plus thiamine  plus folate Start gabapentin 100 mg 3 times daily Recommend Protonix 40 mg p.o. daily before breakfast High-protein diet and strict low-sodium diet, patient is seen by dietitian today Complete abstinence from alcohol use Monitor LFTs weekly upon discharge I have personally discussed with [facility] transplant hepatologist, Dr. Woodfin Ganja over phone regarding patient's case.  Patient does not need inpatient transfer to transplant facility at this time.  She will have a close follow-up with [facility] transplant hepatology team in 1 to 2 weeks after discharge Patient expressed understanding of the plan.  I also discussed with her about return precautions to ER  Patient can go home today    LOS: 5 days   Lannette Donath, MD 09/17/2019, 2:54 PM

## 2019-09-17 NOTE — Progress Notes (Signed)
IV removed before discharge. Went over discharge instructions, appointments and medications with patient. Patient stated that she understood and all questions were answered. Patient going home POV with husband.

## 2019-09-18 NOTE — Discharge Summary (Signed)
5         at [facility]   PATIENT NAME: Marcia Strickland    MR#:  [id number]  DATE OF BIRTH:  Oct 13, 1965  DATE OF ADMISSION:  09/11/2019   ADMITTING PHYSICIAN: Jacques Navy, MD  DATE OF DISCHARGE: 09/17/2019  5:26 PM  PRIMARY CARE PHYSICIAN: Patient, No Pcp Per   ADMISSION DIAGNOSIS:  Hepatitis [K75.9] DISCHARGE DIAGNOSIS:  Principal Problem:   Alcoholic hepatitis with ascites Active Problems:   Jaundice   Acute lower UTI   Hypokalemia   Thrombocytopenia (HCC)   Alcohol dependence (HCC)   Anxiety, generalized   Alcoholic hepatitis   Elevated LFTs  SECONDARY DIAGNOSIS:  History reviewed. No pertinent past medical history. HOSPITAL COURSE:  [name] 54 y.o.  Admitted with alcoholic hepatitis. DF 41.8. Acute hepattiis panel negative. Tbil 24.6. INR 1.6 . USG shows CBD 8 mm - patient does have cirrhosis of liver, with no evidence of portal hypertension. MRCP with no evidence of choledocholithiasis  Acute Alcoholic hepatitis: Prednisolone commenced on 6/4 for acute alcoholic hepatitis, discontinued on 6/9 due to high Lille score, 0.9 No evidence of AKI, kidney function is normal - rifaximin 550 mg twice daily upon discharge Lactulose 20 g as needed 2-3 times daily Continue multivitamin plus thiamine plus folate - gabapentin at DC - Protonix 40 mg p.o. daily before breakfast High-protein diet and strict low-sodium diet Complete abstinence from alcohol use Monitor LFTs weekly upon discharge [name] have personally discussed with [name] transplant hepatologist, Dr. Woodfin Ganja over phone regarding patient's case.  Patient does not need inpatient transfer to transplant facility at this time.  She will have a close follow-up with [facility] transplant hepatology team in 1 to 2 weeks after discharge Patient expressed understanding of the plan.  She also know about return precautions to ER   DISCHARGE CONDITIONS:  Fair CONSULTS OBTAINED:  Treatment Team:    Pasty Spillers, MD DRUG ALLERGIES:  No Known Allergies DISCHARGE MEDICATIONS:   Allergies as of 09/17/2019   No Known Allergies     Medication List    TAKE these medications   aspirin EC 81 MG tablet Take 81 mg by mouth daily.   BENADRYL ALLERGY PO Take 25 mg by mouth daily.   feeding supplement (ENSURE ENLIVE) Liqd Take 237 mLs by mouth daily.   ferrous sulfate 325 (65 FE) MG tablet Take 325 mg by mouth daily with breakfast.   folic acid 1 MG tablet Commonly known as: FOLVITE Take 1 tablet (1 mg total) by mouth daily.   gabapentin 600 MG tablet Commonly known as: NEURONTIN Take 0.5 tablets (300 mg total) by mouth daily.   lactulose 10 GM/15ML solution Commonly known as: CHRONULAC Take 30 mLs (20 g total) by mouth 2 (two) times daily as needed for mild constipation.   multivitamin with minerals Tabs tablet Take 1 tablet by mouth daily.   pantoprazole 40 MG tablet Commonly known as: PROTONIX Take 1 tablet (40 mg total) by mouth 2 (two) times daily before a meal.   Potassium 99 MG Tabs Take 1 tablet by mouth every Friday.   PROBIOTIC ACIDOPHILUS BIOBEADS PO Take 1 capsule by mouth daily.   QC TUMERIC COMPLEX PO Take by mouth.   rifaximin 550 MG Tabs tablet Commonly known as: XIFAXAN Take 1 tablet (550 mg total) by mouth 2 (two) times daily.   thiamine 100 MG tablet Take 1 tablet (100 mg total) by mouth daily.   vitamin B-12 100 MCG  tablet Commonly known as: CYANOCOBALAMIN Take 100 mcg by mouth daily.      DISCHARGE INSTRUCTIONS:   DIET:  High-protein diet and strict low-sodium diet DISCHARGE CONDITION:  Fair ACTIVITY:  Activity as tolerated OXYGEN:  Home Oxygen: No.  Oxygen Delivery: room air DISCHARGE LOCATION:  home   If you experience worsening of your admission symptoms, develop shortness of breath, life threatening emergency, suicidal or homicidal thoughts you must seek medical attention immediately by calling 911 or calling your  MD immediately  if symptoms less severe.  You Must read complete instructions/literature along with all the possible adverse reactions/side effects for all the Medicines you take and that have been prescribed to you. Take any new Medicines after you have completely understood and accpet all the possible adverse reactions/side effects.   Please note  You were cared for by a hospitalist during your hospital stay. If you have any questions about your discharge medications or the care you received while you were in the hospital after you are discharged, you can call the unit and asked to speak with the hospitalist on call if the hospitalist that took care of you is not available. Once you are discharged, your primary care physician will handle any further medical issues. Please note that NO REFILLS for any discharge medications will be authorized once you are discharged, as it is imperative that you return to your primary care physician (or establish a relationship with a primary care physician if you do not have one) for your aftercare needs so that they can reassess your need for medications and monitor your lab values.    On the day of Discharge:  VITAL SIGNS:  Blood pressure 135/80, pulse 86, temperature 98.2 F (36.8 C), temperature source Oral, resp. rate 18, height 5\' 2"  (1.575 m), weight 86.2 kg, SpO2 94 %. PHYSICAL EXAMINATION:  GENERAL:  54 y.o.-year-old patient lying in the bed with no acute distress.  EYES: Pupils equal, round, reactive to light and accommodation. No scleral icterus. Extraocular muscles intact.  HEENT: Head atraumatic, normocephalic. Oropharynx and nasopharynx clear.  NECK:  Supple, no jugular venous distention. No thyroid enlargement, no tenderness.  LUNGS: Normal breath sounds bilaterally, no wheezing, rales,rhonchi or crepitation. No use of accessory muscles of respiration.  CARDIOVASCULAR: S1, S2 normal. No murmurs, rubs, or gallops.  ABDOMEN: Soft, non-tender,  non-distended. Bowel sounds present. No organomegaly or mass.  EXTREMITIES: No pedal edema, cyanosis, or clubbing.  NEUROLOGIC: Cranial nerves II through XII are intact. Muscle strength 5/5 in all extremities. Sensation intact. Gait not checked.  PSYCHIATRIC: The patient is alert and oriented x 3.  SKIN: No obvious rash, lesion, or ulcer.  DATA REVIEW:   CBC Recent Labs  Lab 09/17/19 0702  WBC 19.1*  HGB 13.7  HCT 38.4  PLT 153    Chemistries  Recent Labs  Lab 09/13/19 0412 09/14/19 0612 09/17/19 0702  NA 136   < > 134*  K 4.0   < > 3.7  CL 106   < > 103  CO2 25   < > 21*  GLUCOSE 151*   < > 87  BUN 9   < > 13  CREATININE UNABLE TO RESULT DUE TO ICTERUS   < > UNABLE TO REPORT DUE TO ICTERUS  CALCIUM 7.8*   < > 8.0*  MG 1.7  --   --   AST 177*   < > 216*  ALT 44   < > 101*  ALKPHOS 167*   < >  153*  BILITOT 24.6*   < > 27.0*   < > = values in this interval not displayed.     Outpatient follow-up  Follow-up Information    Rayvon Char, MD. Go on 09/22/2019.   Specialty: Internal Medicine Why: @ 8am     The correct address is [place] [address]  Correct # [phone] Contact information: [address] [place] [address] [phone]                Management plans discussed with the patient, family and they are in agreement.  CODE STATUS: Prior   TOTAL TIME TAKING CARE OF THIS PATIENT: 45 minutes.    Max Sane M.D on 09/18/2019 at 5:27 PM  [facility]   CC: Primary care physician; Patient, No Pcp Per   Note: This dictation was prepared with Dragon dictation along with smaller phrase technology. Any transcriptional errors that result from this process are unintentional.

## 2019-09-22 LAB — CULTURE, BLOOD (ROUTINE X 2)
Culture: NO GROWTH
Culture: NO GROWTH
Special Requests: ADEQUATE
Special Requests: ADEQUATE

## 2019-10-17 ENCOUNTER — Ambulatory Visit: Payer: Managed Care, Other (non HMO) | Admitting: Family Medicine

## 2019-10-17 ENCOUNTER — Other Ambulatory Visit: Payer: Self-pay

## 2019-10-17 ENCOUNTER — Encounter: Payer: Self-pay | Admitting: Family Medicine

## 2019-10-17 VITALS — BP 122/82 | HR 115 | Temp 97.5°F | Ht 62.75 in | Wt 191.8 lb

## 2019-10-17 DIAGNOSIS — K7011 Alcoholic hepatitis with ascites: Secondary | ICD-10-CM

## 2019-10-17 DIAGNOSIS — Z7689 Persons encountering health services in other specified circumstances: Secondary | ICD-10-CM

## 2019-10-17 DIAGNOSIS — W57XXXA Bitten or stung by nonvenomous insect and other nonvenomous arthropods, initial encounter: Secondary | ICD-10-CM | POA: Diagnosis not present

## 2019-10-17 DIAGNOSIS — Z23 Encounter for immunization: Secondary | ICD-10-CM

## 2019-10-17 DIAGNOSIS — S30861A Insect bite (nonvenomous) of abdominal wall, initial encounter: Secondary | ICD-10-CM

## 2019-10-17 DIAGNOSIS — Z720 Tobacco use: Secondary | ICD-10-CM

## 2019-10-17 DIAGNOSIS — F10230 Alcohol dependence with withdrawal, uncomplicated: Secondary | ICD-10-CM

## 2019-10-17 MED ORDER — NICOTINE 14 MG/24HR TD PT24: 14.0000 mg | MEDICATED_PATCH | Freq: Every day | TRANSDERMAL | 0 refills | Status: DC

## 2019-10-17 NOTE — Progress Notes (Signed)
Subjective:    Patient ID: Marcia Strickland, female    DOB: [birth year], 55 y.o.   MRN: [id number]  HPI Chief Complaint  Patient presents with  . New Patient (Initial Visit)    Estab care - pt not able to list previous PCP. Previous admission to [facility] 09/2019 for liver disease   This is a 54 yo female who presents today to establish care. Lives with husband Gerlene Burdock, enjoys gardening, has 4 dogs, works from home for [employer]. Doesn't like her job very much. Demanding position.   Last CPE- many years ago Mammo- never, has scheduled Pap- many years ago, has scheduled for pretransplant  Colonoscopy- never, has scheduled  Tdap- will have today Covid vaccine- has not had yet,  Flu- not regular Covid vaccine-was advised by [facility] to have after she has her mammogram next month Eye- 3 years ago Dental- overdue, has upcoming appointment Exercise-  2017- bit by lone star tick, bite was to abdomen.  Is concerned that she has alpha gal.  Has not been tested for alpha gal. Unable to tolerate red meat, pork, has nausea and severe vomiting, joint/ muscle aches and pains.  Requests testing today.   Alcoholic hepatitis with ascites-was admitted to [facility] last month with jaundice and abdominal swelling.  Is currently being cared for by [facility] and is undergoing work-up for liver transplant.  Stopped drinking alcohol with recent diagnosis of liver failure. Husband has stopped drinking, no alcohol in house.  She feels that she has adequate support if she has cravings for alcohol.  Smoking- has reduced to 1/2 ppd, was smoking 1 ppd. Has been distracting, delaying. Has tried some nicotine gum but did not think she used it properly, had patch in hospital which helped with cravings.  Feels like she is coping well.  Good support systems.  Review of Systems No visual changes, chest pain, cough, wheeze, shortness of breath.  Abdominal fullness present.  Occasional diarrhea and constipation.  No dysuria,  hematuria, urinary frequency, blood in stool.    Objective:   Physical Exam Vitals reviewed.  Constitutional:      General: She is not in acute distress.    Appearance: She is obese. She is ill-appearing. She is not toxic-appearing or diaphoretic.  HENT:     Head: Normocephalic and atraumatic.     Right Ear: External ear normal.     Left Ear: External ear normal.  Eyes:     General: Scleral icterus present.  Cardiovascular:     Rate and Rhythm: Regular rhythm. Tachycardia present.  Pulmonary:     Effort: Pulmonary effort is normal.     Breath sounds: Normal breath sounds.  Abdominal:     General: Bowel sounds are normal. There is distension.     Palpations: Abdomen is soft.     Tenderness: There is no abdominal tenderness. There is no guarding.  Musculoskeletal:     Cervical back: Normal range of motion and neck supple.     Right lower leg: Edema present.     Left lower leg: Edema present.  Skin:    General: Skin is warm and dry.     Coloration: Skin is jaundiced.  Neurological:     Mental Status: She is alert and oriented to person, place, and time.  Psychiatric:        Mood and Affect: Mood normal.        Behavior: Behavior normal.        Thought Content: Thought  content normal.        Judgment: Judgment normal.       BP 122/82 (BP Location: Left Arm, Patient Position: Sitting, Cuff Size: Normal)   Pulse (!) 115   Temp (!) 97.5 F (36.4 C) (Temporal)   Ht 5' 2.75" (1.594 m)   Wt 191 lb 12.8 oz (87 kg)   SpO2 97%   BMI 34.25 kg/m  Wt Readings from Last 3 Encounters:  10/17/19 191 lb 12.8 oz (87 kg)  09/11/19 190 lb (86.2 kg)   Depression screen PHQ 2/9 10/17/2019  Decreased Interest 0  Down, Depressed, Hopeless 0  PHQ - 2 Score 0       Assessment & Plan:  1. Encounter to establish care -Available records and EMR reviewed -She is scheduled to complete health maintenance items with [facility]: Mammogram, colonoscopy/EGD, Pap  2. Tick bite of abdomen, initial  encounter -Symptoms consistent with alpha gal, will check labs - Alpha-Gal Panel  3. Tobacco abuse -Encouraged her efforts at complete cessation and provided written information - nicotine (NICODERM CQ - DOSED IN MG/24 HOURS) 14 mg/24hr patch; Place 1 patch (14 mg total) onto the skin daily.  Dispense: 28 patch; Refill: 0  4. Need for Tdap vaccination - Tdap vaccine greater than or equal to 7yo IM  5. Alcoholic hepatitis with ascites -She is clear on instructions from [facility].  She will continue follow-up with them.  6. Alcohol dependence with uncomplicated withdrawal (HCC) -Is continue to abstain from all alcohol.  Does not require any resources at this time.  -Follow-up in 6 months, sooner if needed  This visit occurred during the SARS-CoV-2 public health emergency.  Safety protocols were in place, including screening questions prior to the visit, additional usage of staff PPE, and extensive cleaning of exam room while observing appropriate contact time as indicated for disinfecting solutions.      Olean Ree, FNP-BC  [facility], [facility]  10/17/2019 1:11 PM

## 2019-10-17 NOTE — Patient Instructions (Addendum)
Good to see you today  Follow up in 6 months   Coping with Quitting Smoking  Quitting smoking is a physical and mental challenge. You will face cravings, withdrawal symptoms, and temptation. Before quitting, work with your health care provider to make a plan that can help you cope. Preparation can help you quit and keep you from giving in. How can I cope with cravings? Cravings usually last for 5-10 minutes. If you get through it, the craving will pass. Consider taking the following actions to help you cope with cravings:  Keep your mouth busy: ? Chew sugar-free gum. ? Suck on hard candies or a straw. ? Brush your teeth.  Keep your hands and body busy: ? Immediately change to a different activity when you feel a craving. ? Squeeze or play with a ball. ? Do an activity or a hobby, like making bead jewelry, practicing needlepoint, or working with wood. ? Mix up your normal routine. ? Take a short exercise break. Go for a quick walk or run up and down stairs. ? Spend time in public places where smoking is not allowed.  Focus on doing something kind or helpful for someone else.  Call a friend or family member to talk during a craving.  Join a support group.  Call a quit line, such as 1-800-QUIT-NOW.  Talk with your health care provider about medicines that might help you cope with cravings and make quitting easier for you. How can I deal with withdrawal symptoms? Your body may experience negative effects as it tries to get used to not having nicotine in the system. These effects are called withdrawal symptoms. They may include:  Feeling hungrier than normal.  Trouble concentrating.  Irritability.  Trouble sleeping.  Feeling depressed.  Restlessness and agitation.  Craving a cigarette. To manage withdrawal symptoms:  Avoid places, people, and activities that trigger your cravings.  Remember why you want to quit.  Get plenty of sleep.  Avoid coffee and other  caffeinated drinks. These may worsen some of your symptoms. How can I handle social situations? Social situations can be difficult when you are quitting smoking, especially in the first few weeks. To manage this, you can:  Avoid parties, bars, and other social situations where people might be smoking.  Avoid alcohol.  Leave right away if you have the urge to smoke.  Explain to your family and friends that you are quitting smoking. Ask for understanding and support.  Plan activities with friends or family where smoking is not an option. What are some ways I can cope with stress? Wanting to smoke may cause stress, and stress can make you want to smoke. Find ways to manage your stress. Relaxation techniques can help. For example:  Breathe slowly and deeply, in through your nose and out through your mouth.  Listen to soothing, relaxing music.  Talk with a family member or friend about your stress.  Light a candle.  Soak in a bath or take a shower.  Think about a peaceful place. What are some ways I can prevent weight gain? Be aware that many people gain weight after they quit smoking. However, not everyone does. To keep from gaining weight, have a plan in place before you quit and stick to the plan after you quit. Your plan should include:  Having healthy snacks. When you have a craving, it may help to: ? Eat plain popcorn, crunchy carrots, celery, or other cut vegetables. ? Chew sugar-free gum.  Changing how you  eat: ? Eat small portion sizes at meals. ? Eat 4-6 small meals throughout the day instead of 1-2 large meals a day. ? Be mindful when you eat. Do not watch television or do other things that might distract you as you eat.  Exercising regularly: ? Make time to exercise each day. If you do not have time for a long workout, do short bouts of exercise for 5-10 minutes several times a day. ? Do some form of strengthening exercise, like weight lifting, and some form of aerobic  exercise, like running or swimming.  Drinking plenty of water or other low-calorie or no-calorie drinks. Drink 6-8 glasses of water daily, or as much as instructed by your health care provider. Summary  Quitting smoking is a physical and mental challenge. You will face cravings, withdrawal symptoms, and temptation to smoke again. Preparation can help you as you go through these challenges.  You can cope with cravings by keeping your mouth busy (such as by chewing gum), keeping your body and hands busy, and making calls to family, friends, or a helpline for people who want to quit smoking.  You can cope with withdrawal symptoms by avoiding places where people smoke, avoiding drinks with caffeine, and getting plenty of rest.  Ask your health care provider about the different ways to prevent weight gain, avoid stress, and handle social situations. This information is not intended to replace advice given to you by your health care provider. Make sure you discuss any questions you have with your health care provider. Document Revised: 03/09/2017 Document Reviewed: 03/24/2016 Elsevier Patient Education  2020 [employer].

## 2019-10-24 LAB — ALPHA-GAL PANEL
Alpha Gal IgE*: <0.1 kU/L (ref <0.10)
Beef (Bos spp) IgE: 0.12 kU/L (ref <0.35)
Class Interpretation: 0
Class Interpretation: 0
Lamb/Mutton (Ovis spp) IgE: <0.1 kU/L (ref <0.35)
Pork (Sus spp) IgE: <0.1 kU/L (ref <0.35)

## 2020-01-06 LAB — HM COLONOSCOPY

## 2020-02-11 ENCOUNTER — Encounter: Payer: Self-pay | Admitting: Family Medicine

## 2020-04-14 ENCOUNTER — Telehealth: Payer: Self-pay

## 2020-04-14 NOTE — Telephone Encounter (Signed)
Received fax from [facility] for Transplant Care stating that patient was not a candidate for liver transplant. Dr. Para March aware in the absence of Marcia Strickland. Per Dr. Para March, patient will need follow-up scheduled in early 2022. Sending to scheduler to set up follow-up appointment.

## 2020-04-14 NOTE — Telephone Encounter (Signed)
Called to scheduled apt

## 2020-04-19 ENCOUNTER — Ambulatory Visit: Payer: Managed Care, Other (non HMO) | Admitting: Family Medicine

## 2020-04-20 ENCOUNTER — Other Ambulatory Visit: Payer: Self-pay

## 2020-04-20 ENCOUNTER — Encounter: Payer: Self-pay | Admitting: Family Medicine

## 2020-04-20 ENCOUNTER — Ambulatory Visit (INDEPENDENT_AMBULATORY_CARE_PROVIDER_SITE_OTHER): Payer: Managed Care, Other (non HMO) | Admitting: Family Medicine

## 2020-04-20 DIAGNOSIS — Z7189 Other specified counseling: Secondary | ICD-10-CM

## 2020-04-20 DIAGNOSIS — K7011 Alcoholic hepatitis with ascites: Secondary | ICD-10-CM | POA: Diagnosis not present

## 2020-04-20 DIAGNOSIS — T781XXA Other adverse food reactions, not elsewhere classified, initial encounter: Secondary | ICD-10-CM | POA: Diagnosis not present

## 2020-04-20 MED ORDER — FUROSEMIDE 20 MG PO TABS: 20.0000 mg | ORAL_TABLET | Freq: Every day | ORAL | Status: DC

## 2020-04-20 NOTE — Patient Instructions (Addendum)
Don't change your meds for now.  Update me as needed.  Take care.  Glad to see you.  Thanks for your effort.  

## 2020-04-20 NOTE — Progress Notes (Signed)
This visit occurred during the SARS-CoV-2 public health emergency.  Safety protocols were in place, including screening questions prior to the visit, additional usage of staff PPE, and extensive cleaning of exam room while observing appropriate contact time as indicated for disinfecting solutions.  Transfer of care.  Note from [facility] reviewed.  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  She cut back from prev.  Cessation encouraged and she will update me as needed  History of liver disease.  Previous evaluation with [facility] hepatology/transplant team.  She is on coreg/spironolactone for varices.  No bleeding except for having mild nosebleeds with dry air.  No vomiting, no fevers.  No bloating.  Sober.  D/w pt.  I thanked her for her effort.  She has no plans to restart etoh.  Rarely lightheaded.   No jaundice.    She is working on getting set up with psychiatry and she needs help with that.  I told her I will check on options.  She has alpha-gal, d/w pt, with most recent panel after stopping mammalian meat for ~ 3 years.  Routine cautions given to patient.  Husband designated if patient were incapacitated.    She has done routine vaccines and I thanked her for that.  Meds, vitals, and allergies reviewed.   ROS: Per HPI unless specifically indicated in ROS section   GEN: nad, alert and oriented HEENT: ncat NECK: supple w/o LA CV: rrr.  PULM: ctab, no inc wob ABD: soft, +bs, nontender. EXT: no edema SKIN: Well-perfused, not jaundiced. No tremor.  Speech and judgment normal.

## 2020-04-21 DIAGNOSIS — Z7189 Other specified counseling: Secondary | ICD-10-CM | POA: Insufficient documentation

## 2020-04-21 DIAGNOSIS — T781XXA Other adverse food reactions, not elsewhere classified, initial encounter: Secondary | ICD-10-CM | POA: Insufficient documentation

## 2020-04-21 NOTE — Assessment & Plan Note (Signed)
She has alpha-gal, d/w pt, with most recent panel after stopping mammalian meat for ~ 3 years.  Routine cautions given to patient.

## 2020-04-21 NOTE — Assessment & Plan Note (Signed)
Husband designated if patient were incapacitated. 

## 2020-04-21 NOTE — Assessment & Plan Note (Signed)
With improvement from alcohol cessation, now thought not to be a transplant candidate.  Discussed rationale for Coreg/spironolactone.  Would continue as is.  If she is more lightheaded she can update me.  Not jaundiced.  No alarming abdominal symptoms.  We talked about continued alcohol cessation and she is interested in following up with psychiatry, with a psychiatrist that is credentialed to meet the requirements for potential transplant, should that need arise in the future.  I told her I would check on that.  I thanked her for effort with sobriety and she will update me as needed.

## 2020-04-29 ENCOUNTER — Telehealth: Payer: Self-pay

## 2020-04-29 NOTE — Telephone Encounter (Signed)
Left message for patient to call back to discuss referral to psychiatry-see referral notes.

## 2020-05-07 NOTE — Telephone Encounter (Signed)
Spoke with patient today. Patient states per Dr Sherryll Burger at [facility] she needed to see about seen psychiatry and have evaluations in case she needs liver transplant in the future but patient states he just released her from the program and she may not need a transplant till like 4 or 5 years from now. Patient wonders well if she sees psychaitry now will those notes count towards her transplant in years to come? Patient states she was told psychiatrist had to have a particular certification for drug and alcohol abuse evaluation for this type of transplant.  Advised patient I will call and find out more information about this situation and who is qualified and would get back in touch with her once I had more information.

## 2020-05-14 NOTE — Telephone Encounter (Signed)
Left message for patient to call back to discuss information I found out for her as we discussed the other day.  I called [facility] Transplant department-Dr Sherryll Burger. Spoke with his nurse Misty Stanley. I was advised that patient does not have to see a psychiatrist for pre evaluation of transplant but needs to see a licensed substance abuse counselor. She needs to see counselor every 2 weeks for 6 months and needs to go ahead and get this set up now so it will be on her record and once she does need a transplant down the road this will be taking care of already. Misty Stanley said paperwork was suppose to be given to the patient about these details when she was here last (I have copy of this paperwork that I will put up front for patient to pick up and have a copy for [place] to review and scan in the chart). The counselor patient does decide to see needs to send notes that patient kept her appointment with them to [facility] transplant office at fax: [phone]. Patient does have follow up appointment with Dr Sherryll Burger in June 2022 per epic and Misty Stanley said at follow up visits labs are been checked and levels followed. Patient can find counselors online, she can go to her insurance website and put in licensed substance abuse counselor for her insurance and set up appointment. No referral is needed.  Sending to Dr Para March as Lorain Childes for update-I will place the information that was sent to me from Liver transplant office for you to review and then be sent for scanning. Thank you

## 2020-05-16 NOTE — Telephone Encounter (Signed)
Noted. Thanks.

## 2020-05-21 NOTE — Telephone Encounter (Signed)
Called patient again and left her a message to call me back. Will wait to hear from patient if she needs help with this and relay information.

## 2021-03-29 IMAGING — MR MR ABDOMEN WO/W CM MRCP
19 of 20 series · 45 of 48 positions shown · IV contrast (gadavist)
Comparison: CT abdomen pelvis, 09/11/2019

CLINICAL DATA: Liver failure, jaundice, alcohol abuse

EXAM:
MRI ABDOMEN WITHOUT AND WITH CONTRAST (INCLUDING MRCP)
TECHNIQUE: Multiplanar multisequence MR imaging of the abdomen was performed
both before and after the administration of intravenous contrast.
Heavily T2-weighted images of the biliary and pancreatic ducts were
obtained, and three-dimensional MRCP images were rendered by post
processing.
CONTRAST:  7mL GADAVIST GADOBUTROL 1 MMOL/ML IV SOLN

[Series 3: T2 · coronal · 6.0mm · 1.19mm/px · 2 of 40 slices shown (1 of 2)]
[im 1/40]
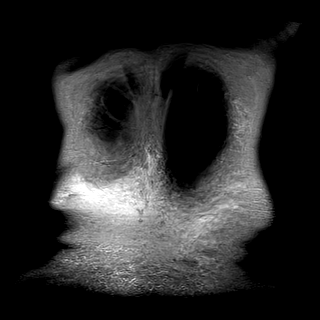
[im 40/40]
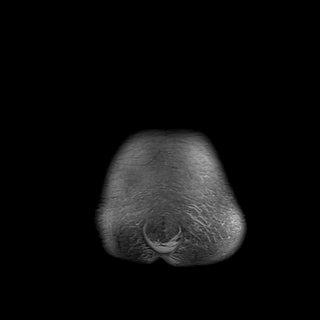

[Series 4: T2 · axial · 6.0mm · 1.19mm/px · 1 of 39 slices shown (2 of 2)]
[im 1/39]
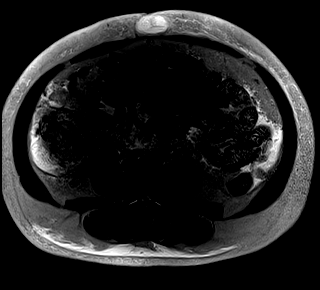

[Series 5: T1 · axial · 6.0mm · 0.74mm/px · z∈[-96,+178]mm · 3 of 78 slices shown (1 of 2)]
[im 1/78]
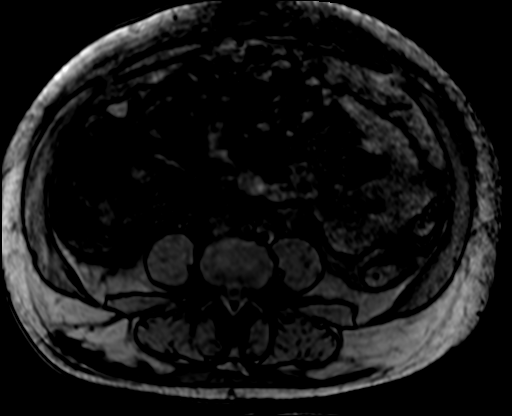
[im 39/78]
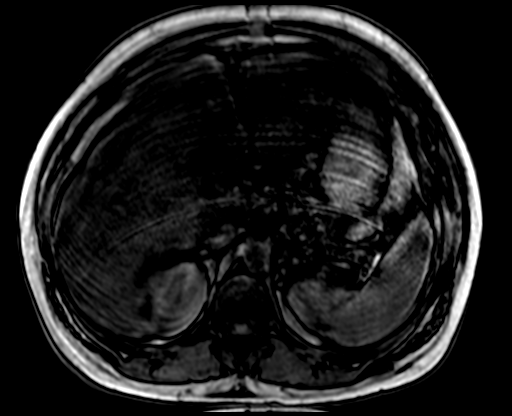
[im 78/78]
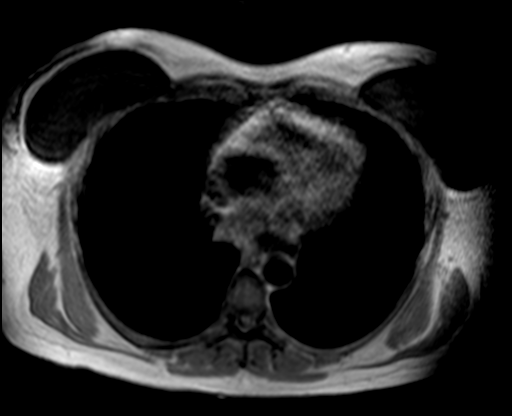

[Series 8: T2 fat-sat · axial · 6.0mm · 1.19mm/px · 1 of 40 slices shown]
[im 1/40]
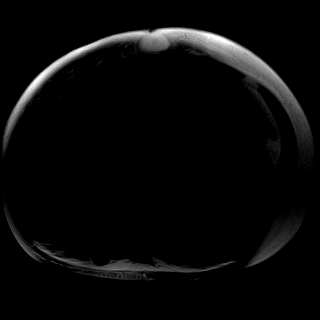

[Series 9: ax dwi_tracew · axial · 6.0mm · 1.42mm/px · z∈[-100,+181]mm · 4 of 120 slices shown]
[im 1/120]
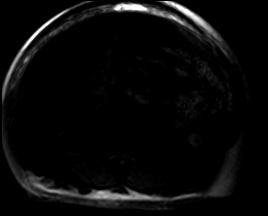
[im 40/120]
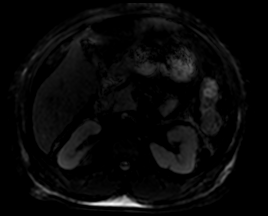
[im 80/120]
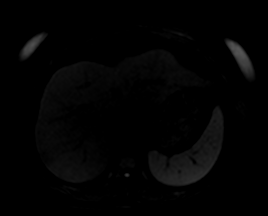
[im 120/120]
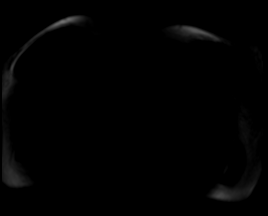

[Series 10: ax dwi_adc · axial · 6.0mm · 1.42mm/px · 1 of 40 slices shown]
[im 1/40]
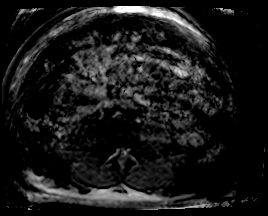

[Series 12: MRCP · coronal · 3.0mm · 1.12mm/px · 1 of 21 slices shown]
[im 1/21]
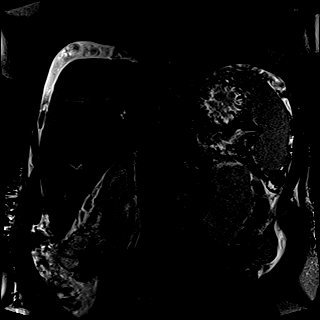

[Series 14: radials · coronal · 50.0mm · 0.78mm/px · 1 of 5 slices shown]
[im 1/5]
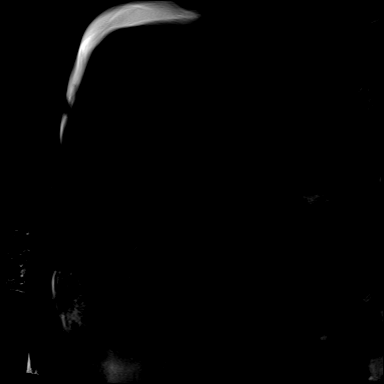

[Series 15: T1 dynamic fat-sat · axial · non-contrast · 3.0mm · 1.19mm/px · z∈[-97,+164]mm · 3 of 88 slices shown (1 of 5)]
[im 1/88]
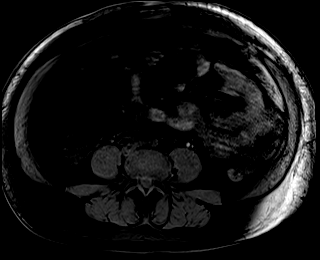
[im 44/88]
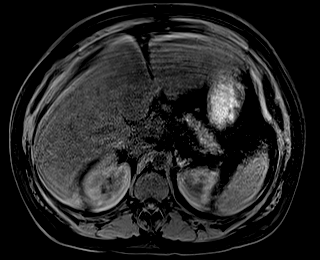
[im 88/88]
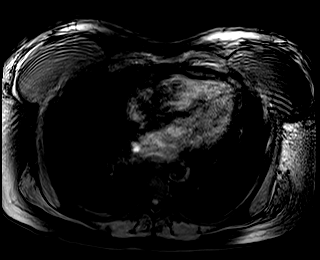

[Series 20: T1 dynamic fat-sat post-contrast · axial · 3.0mm · 1.19mm/px · z∈[-97,+164]mm · 3 of 88 slices shown (1 of 4)]
[im 1/88]
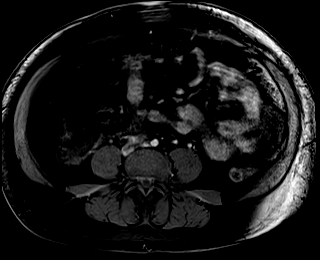
[im 44/88]
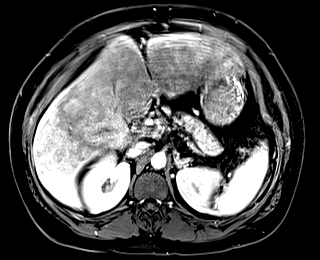
[im 88/88]
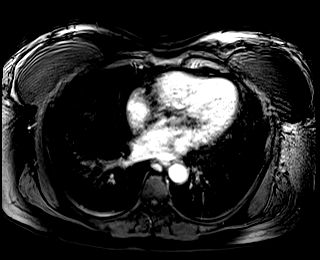

[Series 21: T1 dynamic fat-sat · axial · 3.0mm · 1.19mm/px · z∈[-97,+164]mm · 3 of 88 slices shown (2 of 5)]
[im 1/88]
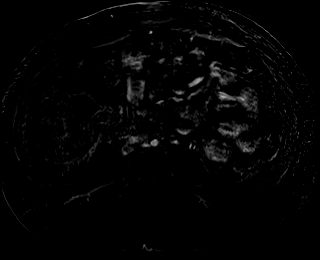
[im 44/88]
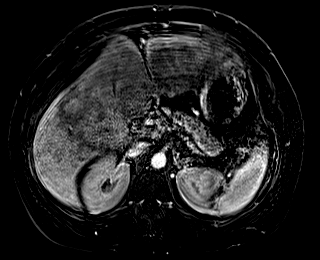
[im 88/88]
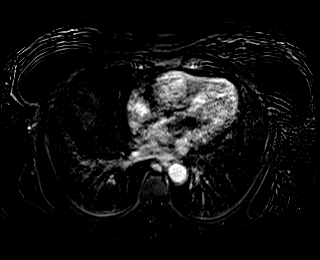

[Series 22: T1 dynamic fat-sat post-contrast · axial · 3.0mm · 1.19mm/px · z∈[-97,+164]mm · 3 of 88 slices shown (2 of 4)]
[im 1/88]
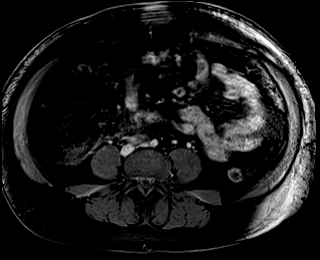
[im 44/88]
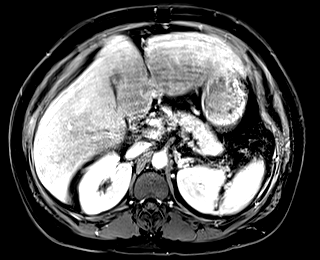
[im 88/88]
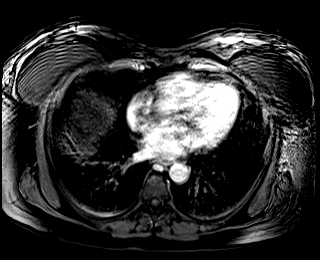

[Series 23: T1 dynamic fat-sat · axial · 3.0mm · 1.19mm/px · z∈[-97,+164]mm · 3 of 88 slices shown (3 of 5)]
[im 1/88]
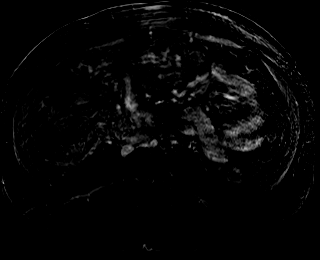
[im 44/88]
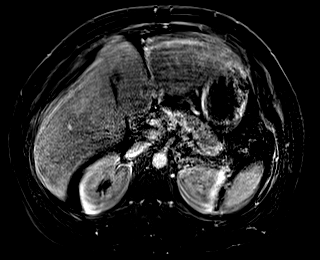
[im 88/88]
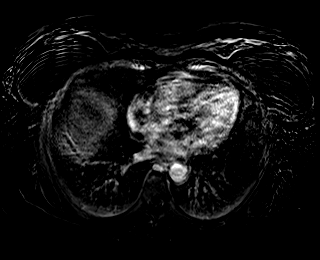

[Series 24: T1 dynamic fat-sat post-contrast · axial · 3.0mm · 1.19mm/px · z∈[-97,+164]mm · 3 of 88 slices shown (3 of 4)]
[im 1/88]
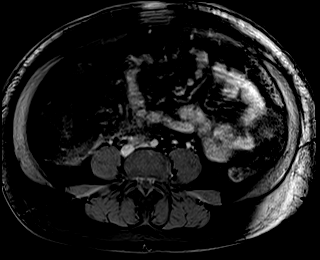
[im 44/88]
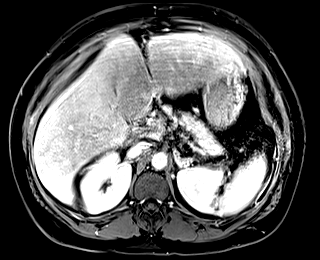
[im 88/88]
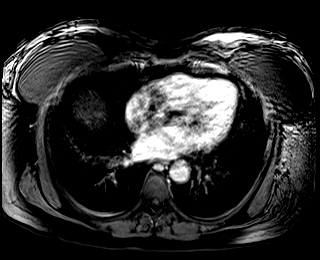

[Series 25: T1 dynamic fat-sat · axial · 3.0mm · 1.19mm/px · z∈[-97,+164]mm · 3 of 88 slices shown (4 of 5)]
[im 1/88]
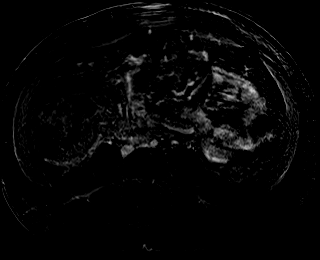
[im 44/88]
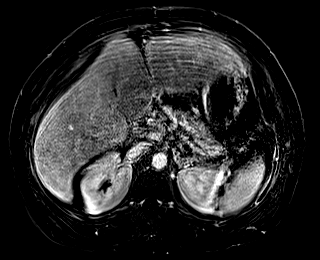
[im 88/88]
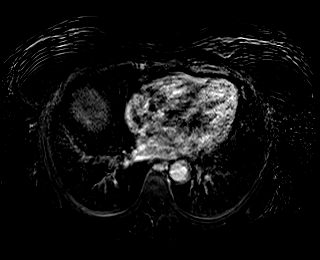

[Series 26: T1 dynamic post-contrast · coronal · 3.0mm · 1.31mm/px · 3 of 88 slices shown]
[im 1/88]
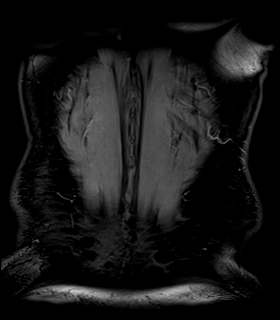
[im 44/88]
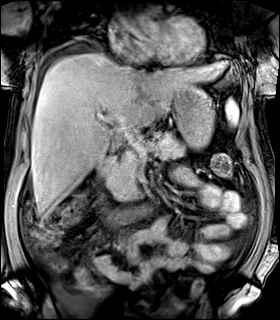
[im 88/88]
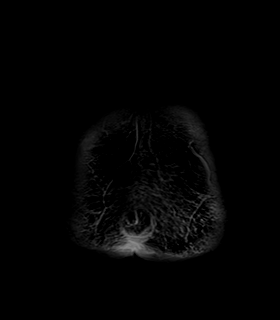

[Series 27: T1 dynamic fat-sat post-contrast · axial · 3.0mm · 1.19mm/px · z∈[-97,+164]mm · 3 of 88 slices shown (4 of 4)]
[im 1/88]
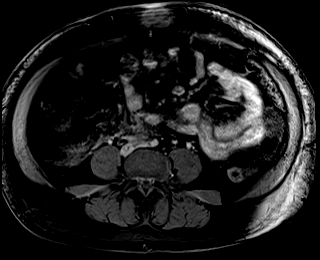
[im 44/88]
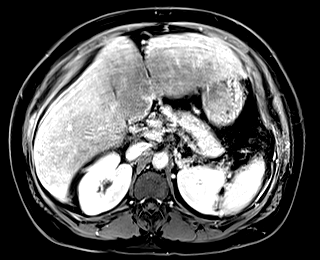
[im 88/88]
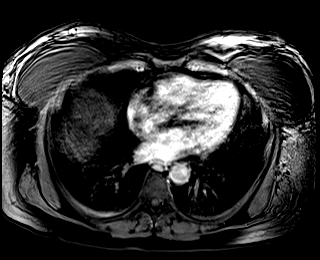

[Series 28: T1 dynamic fat-sat · axial · 3.0mm · 1.19mm/px · z∈[-97,+164]mm · 3 of 88 slices shown (5 of 5)]
[im 1/88]
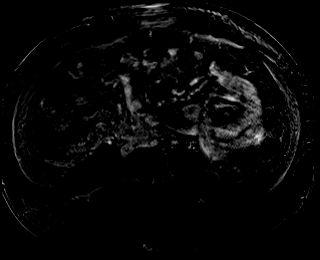
[im 44/88]
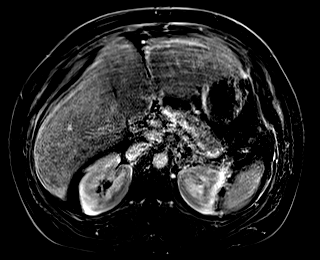
[im 88/88]
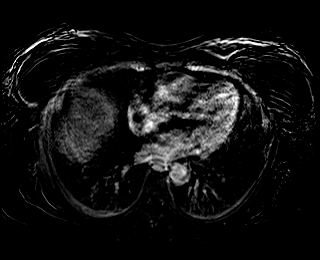

[Series 1013: T1 · axial · 6.0mm · 0.74mm/px · 1 of 39 slices shown (2 of 2)]
[im 1/39]
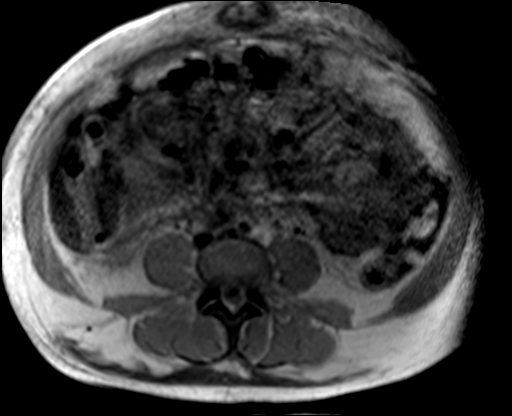

[45 of 48 positions shown; findings below may reference images not displayed]

FINDINGS: Examination is generally somewhat limited by extensive breath motion
artifact throughout.

Lower chest: No acute findings.  Bilateral breast implants.

Hepatobiliary: Hepatic steatosis and heterogeneous, fibrotic
enhancement. No mass or other parenchymal abnormality identified.
There is pericholecystic fluid and mild gallbladder wall thickening,
nonspecific in the setting of ascites.

Pancreas: No mass, inflammatory changes, or other parenchymal
abnormality identified. No pancreatic ductal dilatation, however the
pancreatic duct is poorly visualized due to motion artifact.

Spleen:  At the upper limit of normal size.

Adrenals/Urinary Tract: No masses identified. No evidence of
hydronephrosis.

Stomach/Bowel: Visualized portions within the abdomen are
unremarkable.

Vascular/Lymphatic: No pathologically enlarged lymph nodes
identified. No abdominal aortic aneurysm demonstrated.

Other: Small volume ascites throughout the abdomen. Small, fat and
fluid containing midline epigastric hernia, partially imaged.

Musculoskeletal: No suspicious bone lesions identified.
IMPRESSION: 1. Examination is generally limited by extensive breath motion
artifact throughout. Within this limitation, there is hepatic
steatosis and heterogeneous, fibrotic enhancement suggesting
cirrhosis, however without overt morphologic stigmata. There is no
biliary ductal dilatation or other abnormality.
2. Small volume ascites throughout the abdomen.
3. There is pericholecystic fluid and mild gallbladder wall
thickening, nonspecific in the setting of ascites.
4. Small, fat and fluid containing midline epigastric hernia,
partially imaged.

## 2022-04-19 DIAGNOSIS — K7031 Alcoholic cirrhosis of liver with ascites: Secondary | ICD-10-CM | POA: Insufficient documentation

## 2022-04-19 DIAGNOSIS — I8500 Esophageal varices without bleeding: Secondary | ICD-10-CM | POA: Insufficient documentation

## 2023-04-26 ENCOUNTER — Ambulatory Visit: Payer: Self-pay | Admitting: Family Medicine

## 2023-04-26 NOTE — Telephone Encounter (Addendum)
Copied from CRM [id number]. Topic: Clinical - Red Word Triage >> Apr 26, 2023  8:36 AM Dennison Nancy wrote: Red Word that prompted transfer to Nurse Triage: new patient to establish care , lower extremities pain , rate pain 10, patient has alpha-gal its an allergy 2 weeks thought it was alpha-gal but it might have been nor virus , when lay down leg pain around the hip going down the knee pain rate 10 hard to move  Chief Complaint:  Patient reports  Bilateral  Leg pain that is muscle pain. Patient states she has an allergy called   Alpha Gal. She also believes she may have Fibromyalgia  Symptoms:  Pain rate 4  in both legs from hips down to feet. Frequency:  Constantly  Disposition: [] ED /[x] Urgent Care (no appt availability in office) / [] Appointment(In office/virtual)/ []  [facility]/ [] Home Care/ [] Refused Recommended Disposition /[] [facility]/ []  Follow-up with PCP Additional Notes:  New Patient  scheduled for February  Today patient will go to [facility] . RN Agent assisted with scheduling. Reason for Disposition  Numbness in a leg or foot (i.e., loss of sensation)  Answer Assessment - Initial Assessment Questions 1. ONSET: "When did the pain start?"       2. LOCATION: "Where is the pain located?"       Leg Pain  3. PAIN: "How bad is the pain?"    (Scale 1-10; or mild, moderate, severe)   -Rates Pain  4 on pain scale   -  MODERATE (4-7): interferes with normal activities (e.g., work or school) or awakens from sleep, limping     4. WORK OR EXERCISE: "Has there been any recent work or exercise that involved this part of the body?"        5. CAUSE: "What do you think is causing the leg pain?"      Thinks she has a norovirus. Or fibromyalgia 6. OTHER SYMPTOMS: "Do you have any other symptoms?" (e.g., chest pain, back pain, breathing difficulty, swelling, rash, fever, numbness, weakness)     Rosacea , in menopause 7. PREGNANCY: "Is there any chance you are  pregnant?" "When was your last menstrual period?"      Denies.  4 years  Protocols used: Leg Pain-A-AH

## 2023-04-28 ENCOUNTER — Ambulatory Visit
Admission: EM | Admit: 2023-04-28 | Discharge: 2023-04-28 | Disposition: A | Discharge status: Home / Self Care | Payer: Managed Care, Other (non HMO) | Attending: Emergency Medicine | Admitting: Emergency Medicine

## 2023-04-28 ENCOUNTER — Encounter: Payer: Self-pay | Admitting: *Deleted

## 2023-04-28 DIAGNOSIS — M25552 Pain in left hip: Secondary | ICD-10-CM

## 2023-04-28 DIAGNOSIS — M25551 Pain in right hip: Secondary | ICD-10-CM

## 2023-04-28 HISTORY — DX: Rosacea, unspecified: L71.9

## 2023-04-28 HISTORY — DX: Essential (primary) hypertension: I10

## 2023-04-28 HISTORY — DX: Allergy to other foods: Z91.018

## 2023-04-28 HISTORY — DX: Scoliosis, unspecified: M41.9

## 2023-04-28 MED ORDER — CYCLOBENZAPRINE HCL 10 MG PO TABS: 10.0000 mg | ORAL_TABLET | Freq: Every day | ORAL | 0 refills | Status: DC

## 2023-04-28 MED ORDER — KETOROLAC TROMETHAMINE 30 MG/ML IJ SOLN
30.0000 mg | Freq: Once | INTRAMUSCULAR | Status: AC
  Administered 2023-04-28: 30 mg via INTRAMUSCULAR

## 2023-04-28 MED ORDER — PREDNISONE 10 MG (21) PO TBPK: ORAL_TABLET | Freq: Every day | ORAL | 0 refills | Status: DC

## 2023-04-28 NOTE — Discharge Instructions (Addendum)
Your pain is most likely caused by irritation to the muscle.  You have been given an injection of Toradol to see some relief within the hour  Starting tomorrow take oral prednisone every morning with food as directed to reduce inflammation and help with pain, may take Tylenol or any topical medicine in addition to this  You may use muscle relaxant at bedtime for additional comfort to help allow for rest  You may use heating pad in 15 minute intervals as needed for additional comfort,  Continue massaging and stretching affected area daily for 10 minutes as tolerated to further loosen muscles   When sitting and  lying down place pillow underneath and between knees for support  Can try sleeping without pillow on firm mattress   Practice good posture: head back, shoulders back, chest forward, pelvis back and weight distributed evenly on both legs  If pain persist after recommended treatment or reoccurs if may be beneficial to follow up with orthopedic specialist for evaluation, this doctor specializes in the bones and can manage your symptoms long-term with options such as but not limited to imaging, medications or physical therapy

## 2023-04-28 NOTE — ED Provider Notes (Addendum)
[name]    CSN: [id number] Arrival date & time: 04/28/23  1048      History   Chief Complaint Chief Complaint  Patient presents with   Hip Pain    HPI Marcia Strickland is a 58 y.o. female.   Patient presents for evaluation of bilateral hip pain radiating to the legs present for 8 days.  Occurring intermittently.  Symptoms started abruptly without precipitating event, injury or trauma, change in activity.  Denies numbness or tingling.  Has occurred in the past and at that time x-ray completed which was negative.  Believes to be triggered by cold weather.  Has attempted use of a muscle gun, heating pad, ibuprofen, warm baths and massage and stretching and also been seen by her chiropractor twice who typically manages her back pain.   Past Medical History:  Diagnosis Date   Allergy to alpha-gal    Hypertension    Liver disease    on the transplant list with [facility], [place]   Rosacea    Scoliosis     Patient Active Problem List   Diagnosis Date Noted   Advance care planning 04/21/2020   Allergic reaction to alpha-gal 04/21/2020   Elevated LFTs    Thrombocytopenia (HCC) 09/13/2019   Alcohol dependence (HCC) 09/13/2019   Anxiety, generalized 09/13/2019   Alcoholic hepatitis with ascites 09/12/2019    Past Surgical History:  Procedure Laterality Date   BREAST SURGERY     implants bilaterally.    TUBAL LIGATION      OB History   No obstetric history on file.      Home Medications    Prior to Admission medications   Medication Sig Start Date End Date Taking? Authorizing Provider  cyclobenzaprine (FLEXERIL) 10 MG tablet Take 1 tablet (10 mg total) by mouth at bedtime. 04/28/23  Yes Suesan Mohrmann R, NP  predniSONE (STERAPRED UNI-PAK 21 TAB) 10 MG (21) TBPK tablet Take by mouth daily. Take 6 tabs by mouth daily  for 1 days, then 5 tabs for 1 days, then 4 tabs for 1 days, then 3 tabs for 1 days, 2 tabs for 1 days, then 1 tab by mouth daily for 1 days 04/28/23  Yes  Alwaleed Obeso R, NP  b complex vitamins tablet Take 1 tablet by mouth daily.    [provider]  carvedilol (COREG) 3.125 MG tablet Take by mouth 2 (two) times daily with a meal. 03/31/20 03/31/21  [provider]  diphenhydrAMINE HCl (BENADRYL ALLERGY PO) Take 25 mg by mouth daily.    [provider]  feeding supplement, ENSURE ENLIVE, (ENSURE ENLIVE) LIQD Take 237 mLs by mouth daily. 09/18/19   Delfino Lovett, MD  ferrous sulfate 325 (65 FE) MG tablet Take 325 mg by mouth daily with breakfast.    [provider]  furosemide (LASIX) 20 MG tablet Take 1 tablet (20 mg total) by mouth daily. 04/20/20 04/15/21  Joaquim Nam, MD  Multiple Vitamin (MULTIVITAMIN WITH MINERALS) TABS tablet Take 1 tablet by mouth daily. 09/18/19   Delfino Lovett, MD  Potassium 99 MG TABS Take 1 tablet by mouth every Friday.    [provider]  Probiotic Product (PROBIOTIC ACIDOPHILUS BIOBEADS PO) Take 1 capsule by mouth daily.    [provider]  spironolactone (ALDACTONE) 50 MG tablet Take 50 mg by mouth daily. 09/22/19 09/16/20  [provider]  thiamine 100 MG tablet Take 1 tablet (100 mg total) by mouth daily. 09/18/19   Delfino Lovett, MD  Turmeric (QC TUMERIC COMPLEX PO) Take by mouth.    [provider]    Family History Family History  Problem Relation Age of Onset   Breast cancer Mother    Kidney failure Father    Breast cancer Maternal Grandmother    Leukemia Maternal Grandfather    Diabetes Maternal Grandfather    Hypertension Maternal Grandfather    CAD Paternal Grandfather    Peripheral Artery Disease Paternal Grandfather    Colon cancer Neg Hx     Social History Social History   Tobacco Use   Smoking status: Every Day    Current packs/day: 1.00    Average packs/day: 1 pack/day for 30.0 years (30.0 ttl pk-yrs)    Types: Cigarettes   Smokeless tobacco: Never   Tobacco comments:    1/2 PPD since liver disease dx 09/17/19  Vaping Use    Vaping status: Never Used  Substance Use Topics   Alcohol use: Yes    Comment: 4 drinks weekly wine   Drug use: Never     Allergies   Patient has no known allergies.   Review of Systems Review of Systems   Physical Exam Triage Vital Signs ED Triage Vitals  Encounter Vitals Group     BP 04/28/23 1112 (!) 145/83     Systolic BP Percentile --      Diastolic BP Percentile --      Pulse Rate 04/28/23 1112 (!) 122     Resp 04/28/23 1112 18     Temp 04/28/23 1112 97.9 F (36.6 C)     Temp Source 04/28/23 1112 Oral     SpO2 04/28/23 1112 94 %     Weight 04/28/23 1106 190 lb (86.2 kg)     Height 04/28/23 1106 5' 2.5" (1.588 m)     Head Circumference --      Peak Flow --      Pain Score 04/28/23 1105 3     Pain Loc --      Pain Education --      Exclude from Growth Chart --    No data found.  Updated Vital Signs BP (!) 145/83 (BP Location: Left Arm) Comment: not currently on BP med  Pulse (!) 122 Comment: patient states chronically high  Temp 97.9 F (36.6 C) (Oral)   Resp 18   Ht 5' 2.5" (1.588 m)   Wt 190 lb (86.2 kg)   SpO2 94%   BMI 34.20 kg/m   Visual Acuity Right Eye Distance:   Left Eye Distance:   Bilateral Distance:    Right Eye Near:   Left Eye Near:    Bilateral Near:     Physical Exam Constitutional:      Appearance: Normal appearance.  Eyes:     Extraocular Movements: Extraocular movements intact.  Pulmonary:     Effort: Pulmonary effort is normal.  Musculoskeletal:     Comments: Tenderness present to the center of the right gluteus maximus without ecchymosis swelling or deformity, no tenderness to the bilateral SI joint, able to bear weight to the lower extremities, has full range of motion of the bilateral hips, 2+ femoral pulses  Neurological:     Mental Status: She is alert and oriented to person, place, and time. Mental status is at baseline.      UC Treatments / Results  Labs (all labs ordered are listed, but only abnormal  results are displayed) Labs Reviewed - No data to display  EKG   Radiology No  results found.  Procedures Procedures (including critical care time)  Medications Ordered in UC Medications  ketorolac (TORADOL) 30 MG/ML injection 30 mg (30 mg Intramuscular Given 04/28/23 1140)    Initial Impression / Assessment and Plan / UC Course  I have reviewed the triage vital signs and the nursing notes.  Pertinent labs & imaging results that were available during my care of the patient were reviewed by me and considered in my medical decision making (see chart for details).  Bilateral hip pain  Etiology most likely muscular, stable for outpatient management at this time will defer imaging due to lack of injury, Toradol IM given and prescribed prednisone and Flexeril recommended continued supportive care with follow-up with urgent care or orthopedic doctor as needed, has PCP appointment on 2/10 Final Clinical Impressions(s) / UC Diagnoses   Final diagnoses:  Bilateral hip pain     Discharge Instructions      Your pain is most likely caused by irritation to the muscle.  You have been given an injection of Toradol to see some relief within the hour  Starting tomorrow take oral prednisone every morning with food as directed to reduce inflammation and help with pain, may take Tylenol or any topical medicine in addition to this  You may use muscle relaxant at bedtime for additional comfort to help allow for rest  You may use heating pad in 15 minute intervals as needed for additional comfort,  Continue massaging and stretching affected area daily for 10 minutes as tolerated to further loosen muscles   When sitting and  lying down place pillow underneath and between knees for support  Can try sleeping without pillow on firm mattress   Practice good posture: head back, shoulders back, chest forward, pelvis back and weight distributed evenly on both legs  If pain persist after  recommended treatment or reoccurs if may be beneficial to follow up with orthopedic specialist for evaluation, this doctor specializes in the bones and can manage your symptoms long-term with options such as but not limited to imaging, medications or physical therapy      ED Prescriptions     Medication Sig Dispense Auth. Provider   predniSONE (STERAPRED UNI-PAK 21 TAB) 10 MG (21) TBPK tablet Take by mouth daily. Take 6 tabs by mouth daily  for 1 days, then 5 tabs for 1 days, then 4 tabs for 1 days, then 3 tabs for 1 days, 2 tabs for 1 days, then 1 tab by mouth daily for 1 days 21 tablet Jessee Newnam R, NP   cyclobenzaprine (FLEXERIL) 10 MG tablet Take 1 tablet (10 mg total) by mouth at bedtime. 10 tablet Valinda Hoar, NP      PDMP not reviewed this encounter.   Valinda Hoar, NP 04/28/23 1155    Salli Quarry R, NP 04/28/23 1155

## 2023-04-28 NOTE — ED Triage Notes (Signed)
Patient states history of alpha gal, bilateral hip to knee pain for weeks.  Has appt with PCP in Feb.

## 2023-05-31 ENCOUNTER — Ambulatory Visit: Payer: Managed Care, Other (non HMO) | Admitting: Family

## 2023-06-18 ENCOUNTER — Ambulatory Visit: Payer: Self-pay | Admitting: Family Medicine

## 2023-06-18 NOTE — Telephone Encounter (Signed)
 Copied from CRM [id number]. Topic: Clinical - Red Word Triage >> Jun 18, 2023  8:20 AM Marcia Strickland wrote: [facility] that prompted transfer to Nurse Triage: severe pain in buttocks, states she has not eaten in two days.   Chief Complaint: Bilateral buttocks pain Symptoms: Pain Frequency: since Christmas 2023--1 year and 3 months ago.  Current episode two days ago it started Pertinent Negatives: Patient denies fever, chest pain, difficulty breathing Disposition: [x] ED /[] Urgent Care (no appt availability in office) / [] Appointment(In office/virtual)/ []  [facility]/ [] Home Care/ [] Refused Recommended Disposition /[] [facility]/ []  Follow-up with PCP Additional Notes: Patient called and advised that a year and 3 months.  Patient added that she fell from a tree when she was [age] years old--she went to [facility] at that time and since then she has had a touch of scoliosis and a couple of inches on her back that is always numb.  Pt saw a chiropractor and still sees him. Patient states that since January of this year she has been in constant pain. Pt went to Urgent Care in [place] states she was treated for the pain but not tested for NoroVirus. 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  Patient states she also has nausea/vomiting every time she has pain in her buttocks.  Patient states that her mother died of breast cancer and patient is going through menopause at this time.  She is wondering if this is something with her connective tissue or muscle that is exacerbated due to not taking hormones.  Patient states that her pain gets so bad that she thinks that it causes an inflammatory response and makes her physically experience nausea/vomiting.  Urgent care was put on steroids and muscle relaxers.  Patient is advised that at this time having severe pain and nausea/vomiting so bad that she cannot  keep anything down--Patient audibly sounds like she is groaning in pain every so often.--She is advised that the Emergency Room is going to be recommended at this time to get this episode under control and best for her immediate care at this time.  She is advised that she can then get back in contact with [place] to try and get a sooner appointment then September to follow up for continual care of her concerns.  Patient states that she knows that the ER is going to be recommended but she also wants to get in with a PCP for in depth continual treatment/assessment of what has been going on with her for the past year and a half. Patient is encouraged repeatedly to seek immediate medical assessment/treatment at the Emergency Room at this time for the severe pain & nausea/vomiting.  She is also encouraged to have someone drive her there.  She states that her husband is with her.  Patient states that she is going to get herself taken care of and then get back in touch with [place] for continuation of care afterwards.      Reason for Disposition  [1] SEVERE pain (e.g., excruciating, unable to do any normal activities) AND [2] not improved 2 hours after pain medicine  [1] Drinking very little AND [2] dehydration suspected (e.g., no urine > 12 hours, very dry mouth, very lightheaded)  Answer Assessment - Initial Assessment Questions 1. ONSET: "When did the muscle aches or body pains start?"      2 days ago  2. LOCATION: "What part of your body is hurting?" (e.g., entire body, arms, legs)      Bilateral buttocks 3. SEVERITY: "How bad is the pain?" (Scale 1-10; or mild, moderate, severe)   - MILD (1-3): doesn't interfere with normal activities    - MODERATE (4-7): interferes with normal activities or awakens from sleep    - SEVERE (8-10):  excruciating pain, unable to do any normal activities      8-9 4. CAUSE: "What do you think is causing the pains?"     Unknown exactly 5. FEVER: "Have you been having fever?"      No 6. OTHER SYMPTOMS: "Do you have any other symptoms?" (e.g., chest pain, weakness, rash, cold or flu symptoms, weight loss)     Nausea/vomiting 7. PREGNANCY: "Is there any chance you are pregnant?" "When was your last menstrual period?"     No 8. TRAVEL: "Have you traveled out of the country in the last month?" (e.g., travel history, exposures)     No  Answer Assessment - Initial Assessment Questions 1. VOMITING SEVERITY: "How many times have you vomited in the past 24 hours?"     - MILD:  1 - 2 times/day    - MODERATE: 3 - 5 times/day, decreased oral intake without significant weight loss or symptoms of dehydration    - SEVERE: 6 or more times/day, vomits everything or nearly everything, with significant weight loss, symptoms of dehydration      mild 2. ONSET: "When did the vomiting begin?"      2 days ago 3. FLUIDS: "What fluids or food have you vomited up today?" "Have you been able to keep any fluids down?"     No 4. ABDOMEN PAIN: "Are your having any abdomen pain?" If Yes : "How bad is it and what does it feel like?" (e.g., crampy, dull, intermittent, constant)      No 5. DIARRHEA: "Is there any diarrhea?" If Yes, ask: "How many times today?"      No 6. CONTACTS: "Is there anyone else in the family with the same symptoms?"      No 7. CAUSE: "What do you think is causing your vomiting?"     Bilateral buttocks pain 8. HYDRATION STATUS: "Any signs of dehydration?" (e.g., dry mouth [not only dry lips], too weak to stand) "When did you last urinate?"     Unable to keep anything done 9. OTHER SYMPTOMS: "Do you have any other symptoms?" (e.g., fever, headache, vertigo, vomiting blood or coffee grounds, recent head injury)     Bilateral buttocks pain 10. PREGNANCY: "Is there any chance you are pregnant?" "When was your last menstrual period?"       No  Protocols used: Muscle Aches and Body Pain-A-AH, Vomiting-A-AH

## 2023-06-19 NOTE — Telephone Encounter (Signed)
 Reviewed patient chart do not see where any notes from ED visit. Do you want [place] to call and check on patient?

## 2023-06-19 NOTE — Telephone Encounter (Signed)
 That would be reasonable.  Per note, she was given ER dispo.  Thanks.

## 2023-06-20 NOTE — Telephone Encounter (Signed)
 Reached out to patient left vm for her to return call. Checking on her to see how she is doing.

## 2023-06-21 NOTE — Telephone Encounter (Signed)
 Unable to reach patient. Left voicemail to return call to our office.

## 2023-06-22 NOTE — Telephone Encounter (Signed)
 Duly noted.  If sx return, then reasonable to get evaluated here or wherever possible.  Thanks.

## 2023-06-22 NOTE — Telephone Encounter (Signed)
 Contacted pt.   Pt states that her symptoms have gone away for now. Says symptoms may return.  Complains that she has episodes of pain in buttocks and vomiting for hours.  Pt states she it trying to keep it under control by figuring out her trigger foods. (Says she cannot eat her yogurt anymore)  Pt states that after an episode she has to wait up to 12 hours before she can regain an appetite and bounce back.

## 2023-07-09 ENCOUNTER — Ambulatory Visit: Admission: EM | Admit: 2023-07-09 | Discharge: 2023-07-09 | Disposition: A | Discharge status: Home / Self Care

## 2023-07-09 DIAGNOSIS — M791 Myalgia, unspecified site: Secondary | ICD-10-CM

## 2023-07-09 DIAGNOSIS — M7918 Myalgia, other site: Secondary | ICD-10-CM

## 2023-07-09 MED ORDER — METHOCARBAMOL 500 MG PO TABS: 500.0000 mg | ORAL_TABLET | Freq: Two times a day (BID) | ORAL | 0 refills | Status: DC | PRN

## 2023-07-09 NOTE — ED Provider Notes (Signed)
 [facility]    CSN: [id number] Arrival date & time: 07/09/23  0847      History   Chief Complaint Chief Complaint  Patient presents with   Back Pain    HPI Marcia [facility] is a 58 y.o. female.  Patient presents with muscular pain in her buttocks and bilateral lower back which radiates down both legs to knees.  No falls or injury.  Patient reports this has been an intermittent chronic problem for years.  Most recent episode started 5 days ago.  Treatment attempted with massage and heat.  She denies numbness, weakness, saddle anesthesia, loss of bowel/bladder control, abdominal pain, dysuria, hematuria, wounds, redness, bruising.  Patient was seen at this urgent care for similar symptoms on 04/28/2023; diagnosed with bilateral hip pain; treated with cyclobenzaprine and prednisone.  The history is provided by the patient and medical records.    Past Medical History:  Diagnosis Date   Allergy to alpha-gal    Hypertension    Liver disease    on the transplant list with [facility]   Rosacea    Scoliosis     Patient Active Problem List   Diagnosis Date Noted   Advance care planning 04/21/2020   Allergic reaction to alpha-gal 04/21/2020   Elevated LFTs    Thrombocytopenia (HCC) 09/13/2019   Alcohol dependence (HCC) 09/13/2019   Anxiety, generalized 09/13/2019   Alcoholic hepatitis with ascites 09/12/2019    Past Surgical History:  Procedure Laterality Date   BREAST SURGERY     implants bilaterally.    TUBAL LIGATION      OB History   No obstetric history on file.      Home Medications    Prior to Admission medications   Medication Sig Start Date End Date Taking? Authorizing Provider  methocarbamol (ROBAXIN) 500 MG tablet Take 1 tablet (500 mg total) by mouth 2 (two) times daily as needed for muscle spasms. 07/09/23  Yes Mickie Bail, NP  progesterone (PROMETRIUM) 200 MG capsule Take 200 mg by mouth daily.   Yes [provider]  b complex vitamins tablet  Take 1 tablet by mouth daily. Patient not taking: Reported on 07/09/2023    [provider]  carvedilol (COREG) 3.125 MG tablet Take by mouth 2 (two) times daily with a meal. Patient not taking: Reported on 07/09/2023 03/31/20 03/31/21  [provider]  cyclobenzaprine (FLEXERIL) 10 MG tablet Take 1 tablet (10 mg total) by mouth at bedtime. Patient not taking: Reported on 07/09/2023 04/28/23   Valinda Hoar, NP  diphenhydrAMINE HCl (BENADRYL ALLERGY PO) Take 25 mg by mouth daily. Patient not taking: Reported on 07/09/2023    [provider]  feeding supplement, ENSURE ENLIVE, (ENSURE ENLIVE) LIQD Take 237 mLs by mouth daily. Patient not taking: Reported on 07/09/2023 09/18/19   Delfino Lovett, MD  ferrous sulfate 325 (65 FE) MG tablet Take 325 mg by mouth daily with breakfast. Patient not taking: Reported on 07/09/2023    [provider]  furosemide (LASIX) 20 MG tablet Take 1 tablet (20 mg total) by mouth daily. Patient not taking: Reported on 07/09/2023 04/20/20 04/15/21  Joaquim Nam, MD  Multiple Vitamin (MULTIVITAMIN WITH MINERALS) TABS tablet Take 1 tablet by mouth daily. Patient not taking: Reported on 07/09/2023 09/18/19   Delfino Lovett, MD  Potassium 99 MG TABS Take 1 tablet by mouth every Friday. Patient not taking: Reported on 07/09/2023    [provider]  predniSONE (STERAPRED UNI-PAK 21 TAB) 10  MG (21) TBPK tablet Take by mouth daily. Take 6 tabs by mouth daily  for 1 days, then 5 tabs for 1 days, then 4 tabs for 1 days, then 3 tabs for 1 days, 2 tabs for 1 days, then 1 tab by mouth daily for 1 days Patient not taking: Reported on 07/09/2023 04/28/23   Valinda Hoar, NP  Probiotic Product (PROBIOTIC ACIDOPHILUS BIOBEADS PO) Take 1 capsule by mouth daily. Patient not taking: Reported on 07/09/2023    [provider]  spironolactone (ALDACTONE) 50 MG tablet Take 50 mg by mouth daily. Patient not taking: Reported on 07/09/2023 09/22/19 09/16/20   [provider]  thiamine 100 MG tablet Take 1 tablet (100 mg total) by mouth daily. Patient not taking: Reported on 07/09/2023 09/18/19   Delfino Lovett, MD  Turmeric (QC TUMERIC COMPLEX PO) Take by mouth. Patient not taking: Reported on 07/09/2023    [provider]    Family History Family History  Problem Relation Age of Onset   Breast cancer Mother    Kidney failure Father    Breast cancer Maternal Grandmother    Leukemia Maternal Grandfather    Diabetes Maternal Grandfather    Hypertension Maternal Grandfather    CAD Paternal Grandfather    Peripheral Artery Disease Paternal Grandfather    Colon cancer Neg Hx     Social History Social History   Tobacco Use   Smoking status: Every Day    Current packs/day: 1.00    Average packs/day: 1 pack/day for 30.0 years (30.0 ttl pk-yrs)    Types: Cigarettes   Smokeless tobacco: Never   Tobacco comments:    1/2 PPD since liver disease dx 09/17/19  Vaping Use   Vaping status: Never Used  Substance Use Topics   Alcohol use: Yes    Comment: 4 drinks weekly wine   Drug use: Never     Allergies   Alpha-gal   Review of Systems Review of Systems  Constitutional:  Negative for chills and fever.  Respiratory:  Negative for cough and shortness of breath.   Cardiovascular:  Negative for chest pain and palpitations.  Gastrointestinal:  Negative for abdominal pain.  Genitourinary:  Negative for dysuria and hematuria.  Musculoskeletal:  Positive for back pain and myalgias. Negative for arthralgias.  Skin:  Negative for color change, rash and wound.  Neurological:  Negative for weakness and numbness.     Physical Exam Triage Vital Signs ED Triage Vitals [07/09/23 0939]  Encounter Vitals Group     BP      Systolic BP Percentile      Diastolic BP Percentile      Pulse Rate (!) 103     Resp 18     Temp 97.9 F (36.6 C)     Temp src      SpO2 95 %     Weight      Height      Head Circumference      Peak Flow       Pain Score      Pain Loc      Pain Education      Exclude from Growth Chart    No data found.  Updated Vital Signs BP 133/68   Pulse (!) 103   Temp 97.9 F (36.6 C)   Resp 18   SpO2 95%   Visual Acuity Right Eye Distance:   Left Eye Distance:   Bilateral Distance:    Right Eye Near:  Left Eye Near:    Bilateral Near:     Physical Exam Constitutional:      General: She is not in acute distress. HENT:     Mouth/Throat:     Mouth: Mucous membranes are moist.  Cardiovascular:     Rate and Rhythm: Normal rate and regular rhythm.  Pulmonary:     Effort: Pulmonary effort is normal. No respiratory distress.  Abdominal:     General: Bowel sounds are normal.     Palpations: Abdomen is soft.     Tenderness: There is no abdominal tenderness. There is no right CVA tenderness, left CVA tenderness, guarding or rebound.  Musculoskeletal:        General: Tenderness present. No swelling or deformity. Normal range of motion.     Comments: Muscular tenderness in both buttocks.  Skin:    General: Skin is warm and dry.     Capillary Refill: Capillary refill takes less than 2 seconds.     Findings: No bruising, erythema, lesion or rash.  Neurological:     General: No focal deficit present.     Mental Status: She is alert.     Sensory: No sensory deficit.     Motor: No weakness.     Gait: Gait normal.      UC Treatments / Results  Labs (all labs ordered are listed, but only abnormal results are displayed) Labs Reviewed - No data to display  EKG   Radiology No results found.  Procedures Procedures (including critical care time)  Medications Ordered in UC Medications - No data to display  Initial Impression / Assessment and Plan / UC Course  I have reviewed the triage vital signs and the nursing notes.  Pertinent labs & imaging results that were available during my care of the patient were reviewed by me and considered in my medical decision making (see chart  for details).    Myalgias, musculoskeletal pain.  This is an recurrent intermittent problem for several years.  Patient does not currently have a PCP.  Appointment scheduled by our staff for patient to establish with a PCP in 2 days (07/11/2023).  Treating today with methocarbamol.  Precautions for drowsiness with methocarbamol discussed.  Education provided on muscle pain.  ED precautions given.  Patient agrees to plan of care.  Final Clinical Impressions(s) / UC Diagnoses   Final diagnoses:  Myalgia  Musculoskeletal pain     Discharge Instructions      Follow-up with your new primary care provider as scheduled on 07/11/2023.    Take the muscle relaxer as needed for muscle spasm; Do not drive, operate machinery, or drink alcohol with this medication as it can cause drowsiness.       ED Prescriptions     Medication Sig Dispense Auth. Provider   methocarbamol (ROBAXIN) 500 MG tablet Take 1 tablet (500 mg total) by mouth 2 (two) times daily as needed for muscle spasms. 10 tablet Mickie Bail, NP      I have reviewed the PDMP during this encounter.   Mickie Bail, NP 07/09/23 1017

## 2023-07-09 NOTE — ED Notes (Signed)
 Appointment scheduled for patient to establish a PCP.

## 2023-07-09 NOTE — ED Triage Notes (Addendum)
 Patient to Urgent Care with complaints of bilateral hip pain (radiates to knees)/ stomach upset/ poor appetite.  Symptoms started five days ago. Reports hx of the same- seen in January and received steroid prescription which relieved her symptoms.   States she has tried to get in with a pcp but "she is unable to make an appointment until her symptoms are resolved".

## 2023-07-09 NOTE — Discharge Instructions (Addendum)
 Follow-up with your new primary care provider as scheduled on 07/11/2023.    Take the muscle relaxer as needed for muscle spasm; Do not drive, operate machinery, or drink alcohol with this medication as it can cause drowsiness.

## 2023-07-11 ENCOUNTER — Ambulatory Visit: Admitting: Family Medicine

## 2023-07-11 ENCOUNTER — Encounter: Payer: Self-pay | Admitting: Family Medicine

## 2023-07-11 ENCOUNTER — Telehealth: Payer: Self-pay | Admitting: Family Medicine

## 2023-07-11 VITALS — BP 158/102 | HR 121 | Ht 62.0 in | Wt 180.8 lb

## 2023-07-11 DIAGNOSIS — M25561 Pain in right knee: Secondary | ICD-10-CM | POA: Diagnosis not present

## 2023-07-11 DIAGNOSIS — R112 Nausea with vomiting, unspecified: Secondary | ICD-10-CM

## 2023-07-11 DIAGNOSIS — D696 Thrombocytopenia, unspecified: Secondary | ICD-10-CM | POA: Diagnosis not present

## 2023-07-11 DIAGNOSIS — R768 Other specified abnormal immunological findings in serum: Secondary | ICD-10-CM | POA: Insufficient documentation

## 2023-07-11 DIAGNOSIS — Z6833 Body mass index (BMI) 33.0-33.9, adult: Secondary | ICD-10-CM

## 2023-07-11 DIAGNOSIS — I10 Essential (primary) hypertension: Secondary | ICD-10-CM

## 2023-07-11 DIAGNOSIS — G8929 Other chronic pain: Secondary | ICD-10-CM

## 2023-07-11 DIAGNOSIS — E6609 Other obesity due to excess calories: Secondary | ICD-10-CM

## 2023-07-11 DIAGNOSIS — K7011 Alcoholic hepatitis with ascites: Secondary | ICD-10-CM

## 2023-07-11 DIAGNOSIS — Z13220 Encounter for screening for lipoid disorders: Secondary | ICD-10-CM

## 2023-07-11 DIAGNOSIS — Z7689 Persons encountering health services in other specified circumstances: Secondary | ICD-10-CM

## 2023-07-11 DIAGNOSIS — Z299 Encounter for prophylactic measures, unspecified: Secondary | ICD-10-CM

## 2023-07-11 DIAGNOSIS — E66811 Obesity, class 1: Secondary | ICD-10-CM | POA: Diagnosis not present

## 2023-07-11 DIAGNOSIS — Z1231 Encounter for screening mammogram for malignant neoplasm of breast: Secondary | ICD-10-CM

## 2023-07-11 DIAGNOSIS — M25562 Pain in left knee: Secondary | ICD-10-CM

## 2023-07-11 DIAGNOSIS — Z8719 Personal history of other diseases of the digestive system: Secondary | ICD-10-CM

## 2023-07-11 DIAGNOSIS — Z131 Encounter for screening for diabetes mellitus: Secondary | ICD-10-CM

## 2023-07-11 DIAGNOSIS — Z122 Encounter for screening for malignant neoplasm of respiratory organs: Secondary | ICD-10-CM

## 2023-07-11 NOTE — Patient Instructions (Signed)

## 2023-07-11 NOTE — Telephone Encounter (Signed)
 Copied from CRM [id number]. Topic: Clinical - Prescription Issue >> Jul 11, 2023  1:50 PM Benay Spice S wrote: Reason for CRM: Patient's husband called and stated that he reached out to the pharmacy and they stated they have not received the prescription for methocarbamol (ROBAXIN) 500 MG tablet nor the progesterone (PROMETRIUM) 200 MG capsule. Patient would like a callback at [phone].

## 2023-07-11 NOTE — Progress Notes (Signed)
 New Patient Office Visit  Subjective   Patient ID: CAYDEN RAUTIO, female    DOB: 30-Jan-1966  Age: 58 y.o. MRN: [id number]  CC:  Chief Complaint  Patient presents with   Establish Care    Establish care, pt has alpha gal with Nausea, nothing to eat since Friday    HPI KYNDAL HERINGER is a 58 year old female who presents to establish with [facility] at [facility]   CC: Patient here to establish care  Last PCP: Dr. 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  Reports feeling lightheaded/dizzy and increase in flatulence. Denies diarrhea, fever/chills, difficulty breathing.   Depression: for the past 2 years,  Supplements that she was taking, had ingredients she was not able to tolerate. Has been off of for the past 1-2 weeks.  Started to go to chiropractor last Jan. He sent her to have [place] of bilateral hips completed. Still seeing chiropractor monthly. Reports bilateral knee pain.  Father passed last year. She reports she is not depressed, she is more anxious because of everything she is working through.  Lexapro years ago and made her sleep 12 hours per day.   05/2020:  -- Cirrhosis with fibrosis portal hypertension including splenomegaly, trace perihepatic ascites and paraesophageal/upper abdominal varices. -- No suspicious hepatic lesions.   Elevated BP: 153/101 Denies chest pain, shob, lower extremity edema, cough.  Does not check bp at home   Medications: estradiol 0.1% gel packet  Progesterone 100mg  capsule at bedtime  Methocarbamol 500mg  BID PRN for muscle spasms        07/11/2023    9:27 AM 10/17/2019   11:35 AM  PHQ9 SCORE ONLY  PHQ-9 Total Score 21 0      07/11/2023    9:27 AM  GAD 7 :  Generalized Anxiety Score  Nervous, Anxious, on Edge 2  Control/stop worrying 1  Worry too much - different things 1  Trouble relaxing 3  Restless 3  Easily annoyed or irritable 0  Afraid - awful might happen 0  Total GAD 7 Score 10  Anxiety Difficulty Very difficult    Outpatient Encounter Medications as of 07/11/2023  Medication Sig   methocarbamol (ROBAXIN) 500 MG tablet Take 1 tablet (500 mg total) by mouth 2 (two) times daily as needed for muscle spasms.   progesterone (PROMETRIUM) 200 MG capsule Take 200 mg by mouth daily.   [DISCONTINUED] b complex vitamins tablet Take 1 tablet by mouth daily. (Patient not taking: Reported on 07/09/2023)   [DISCONTINUED] carvedilol (COREG) 3.125 MG tablet Take by mouth 2 (two) times daily with a meal. (Patient not taking: Reported on 07/09/2023)   [DISCONTINUED] cyclobenzaprine (FLEXERIL) 10 MG tablet Take 1 tablet (10 mg total) by mouth at bedtime. (Patient not taking: Reported on 07/09/2023)   [DISCONTINUED] diphenhydrAMINE HCl (BENADRYL ALLERGY PO) Take 25 mg by mouth daily. (Patient not taking: Reported on 07/09/2023)   [DISCONTINUED] feeding supplement, ENSURE ENLIVE, (ENSURE ENLIVE) LIQD Take 237 mLs by mouth daily. (Patient not taking: Reported on 07/09/2023)   [DISCONTINUED] ferrous sulfate 325 (65 FE) MG tablet Take 325 mg by mouth daily with breakfast. (Patient not taking: Reported on 07/09/2023)   [DISCONTINUED] furosemide (LASIX) 20 MG tablet Take 1 tablet (20 mg total)  by mouth daily. (Patient not taking: Reported on 07/09/2023)   [DISCONTINUED] Multiple Vitamin (MULTIVITAMIN WITH MINERALS) TABS tablet Take 1 tablet by mouth daily. (Patient not taking: Reported on 07/09/2023)   [DISCONTINUED] Potassium 99 MG TABS Take 1 tablet by mouth every Friday. (Patient not taking: Reported on 07/09/2023)   [DISCONTINUED] predniSONE (STERAPRED UNI-PAK 21 TAB) 10 MG (21) TBPK tablet Take by mouth daily. Take 6 tabs by mouth daily  for 1 days, then 5 tabs for 1  days, then 4 tabs for 1 days, then 3 tabs for 1 days, 2 tabs for 1 days, then 1 tab by mouth daily for 1 days (Patient not taking: Reported on 07/09/2023)   [DISCONTINUED] Probiotic Product (PROBIOTIC ACIDOPHILUS BIOBEADS PO) Take 1 capsule by mouth daily. (Patient not taking: Reported on 07/09/2023)   [DISCONTINUED] spironolactone (ALDACTONE) 50 MG tablet Take 50 mg by mouth daily. (Patient not taking: Reported on 07/09/2023)   [DISCONTINUED] thiamine 100 MG tablet Take 1 tablet (100 mg total) by mouth daily. (Patient not taking: Reported on 07/09/2023)   [DISCONTINUED] Turmeric (QC TUMERIC COMPLEX PO) Take by mouth. (Patient not taking: Reported on 07/09/2023)   No facility-administered encounter medications on file as of 07/11/2023.    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   Alcoholic cirrhosis of liver with ascites (HCC) 04/19/2022   Advance care planning 04/21/2020   Allergic reaction to alpha-gal 04/21/2020   Elevated LFTs    Thrombocytopenia (HCC) 09/13/2019   Alcohol dependence (HCC) 09/13/2019   Anxiety, generalized 09/13/2019   Alcoholic hepatitis with ascites 09/12/2019   Past Medical History:  Diagnosis Date   Allergy to alpha-gal    Hypertension    Liver disease    on the transplant list with [facility]   Rosacea    Scoliosis    Past Surgical History:  Procedure Laterality Date   BREAST SURGERY     implants bilaterally.    TUBAL LIGATION     Family History  Problem Relation Age of Onset   Breast cancer Mother    Kidney failure Father    Breast cancer Maternal Grandmother    Leukemia Maternal Grandfather    Diabetes Maternal Grandfather    Hypertension Maternal Grandfather    CAD Paternal Grandfather    Peripheral Artery Disease Paternal Grandfather    Colon cancer Neg Hx    Social History   Socioeconomic History   Marital status: Married    Spouse name: Not on file   Number of children: Not on file   Years  of education: Not on file   Highest education level: Not on file  Occupational History   Occupation: Full time  Tobacco Use   Smoking status: Every Day    Current packs/day: 1.00    Average packs/day: 1 pack/day for 30.0 years (30.0 ttl pk-yrs)    Types: Cigarettes    Passive exposure: Current   Smokeless tobacco: Never   Tobacco comments:    1/2 PPD since liver disease dx 09/17/19  Vaping Use   Vaping status: Never Used  Substance and Sexual Activity   Alcohol use: Not Currently    Comment: 4 drinks weekly wine   Drug use: Never   Sexual activity: Not on file  Other Topics Concern   Not on file  Social History Narrative   From [employer] for [employer].     Social Drivers of Dispensing optician  Resource Strain: Not on file  Food Insecurity: Not on file  Transportation Needs: Not on file  Physical Activity: Not on file  Stress: Not on file  Social Connections: Not on file  Intimate Partner Violence: Not on file   Outpatient Medications Prior to Visit  Medication Sig Dispense Refill   methocarbamol (ROBAXIN) 500 MG tablet Take 1 tablet (500 mg total) by mouth 2 (two) times daily as needed for muscle spasms. 10 tablet 0   progesterone (PROMETRIUM) 200 MG capsule Take 200 mg by mouth daily.     b complex vitamins tablet Take 1 tablet by mouth daily. (Patient not taking: Reported on 07/09/2023)     carvedilol (COREG) 3.125 MG tablet Take by mouth 2 (two) times daily with a meal. (Patient not taking: Reported on 07/09/2023)     cyclobenzaprine (FLEXERIL) 10 MG tablet Take 1 tablet (10 mg total) by mouth at bedtime. (Patient not taking: Reported on 07/09/2023) 10 tablet 0   diphenhydrAMINE HCl (BENADRYL ALLERGY PO) Take 25 mg by mouth daily. (Patient not taking: Reported on 07/09/2023)     feeding supplement, ENSURE ENLIVE, (ENSURE ENLIVE) LIQD Take 237 mLs by mouth daily. (Patient not taking: Reported on 07/09/2023) 237 mL 12   ferrous sulfate 325 (65 FE) MG  tablet Take 325 mg by mouth daily with breakfast. (Patient not taking: Reported on 07/09/2023)     furosemide (LASIX) 20 MG tablet Take 1 tablet (20 mg total) by mouth daily. (Patient not taking: Reported on 07/09/2023)     Multiple Vitamin (MULTIVITAMIN WITH MINERALS) TABS tablet Take 1 tablet by mouth daily. (Patient not taking: Reported on 07/09/2023) 30 tablet 0   Potassium 99 MG TABS Take 1 tablet by mouth every Friday. (Patient not taking: Reported on 07/09/2023)     predniSONE (STERAPRED UNI-PAK 21 TAB) 10 MG (21) TBPK tablet Take by mouth daily. Take 6 tabs by mouth daily  for 1 days, then 5 tabs for 1 days, then 4 tabs for 1 days, then 3 tabs for 1 days, 2 tabs for 1 days, then 1 tab by mouth daily for 1 days (Patient not taking: Reported on 07/09/2023) 21 tablet 0   Probiotic Product (PROBIOTIC ACIDOPHILUS BIOBEADS PO) Take 1 capsule by mouth daily. (Patient not taking: Reported on 07/09/2023)     spironolactone (ALDACTONE) 50 MG tablet Take 50 mg by mouth daily. (Patient not taking: Reported on 07/09/2023)     thiamine 100 MG tablet Take 1 tablet (100 mg total) by mouth daily. (Patient not taking: Reported on 07/09/2023) 30 tablet 0   Turmeric (QC TUMERIC COMPLEX PO) Take by mouth. (Patient not taking: Reported on 07/09/2023)     No facility-administered medications prior to visit.   Allergies  Allergen Reactions   Alpha-Gal Nausea And Vomiting   ROS: see HPI     Objective  Today's Vitals   07/11/23 0925 07/11/23 1029  BP: (!) 153/101 (!) 158/102  Pulse: (!) 121   SpO2: 94%   Weight: 180 lb 12.8 oz (82 kg)   Height: 5\' 2"  (1.575 m)   PainSc: 9    PainLoc: Groin     GENERAL: Well-appearing, in NAD. Well nourished.  SKIN: Pink, warm and dry. No rash, lesion, ulceration, or ecchymoses.  Head: Normocephalic. NECK: Trachea midline. Full ROM w/o pain or tenderness. No lymphadenopathy.  EARS: Tympanic membranes are intact, translucent without bulging and without drainage. Appropriate  landmarks visualized.  EYES: Conjunctiva clear without exudates. EOMI, PERRL, no drainage present.  NOSE: Septum midline w/o deformity. Nares patent, mucosa pink and non-inflamed w/o drainage. No sinus tenderness.  THROAT: Uvula midline. Oropharynx clear. Tonsils non-inflamed without exudate. Mucous membranes pink and moist.  RESPIRATORY: Chest wall symmetrical. Respirations even and non-labored. Breath sounds clear to auscultation bilaterally.  CARDIAC: S1, S2 present, regular rate and rhythm without murmur or gallops. Peripheral pulses 2+ bilaterally.  MSK: Muscle tone and strength appropriate for age. Joints w/o tenderness, redness, or swelling.  EXTREMITIES: Without clubbing, cyanosis, or edema.  NEUROLOGIC: No motor or sensory deficits. Steady, even gait. C2-C12 intact.  PSYCH/MENTAL STATUS: Alert, oriented x 3. Cooperative, appropriate mood and affect.     Assessment & Plan:   1. Encounter to establish care (Primary) Patient is a 58 year old female who presents today to establish care with primary care at [facility]. Reviewed the past medical history, family history, social history, surgical history, medications and allergies today- updates made as indicated. Patient has concerns today about her chronic conditions.    2. Primary hypertension Patient presents today with elevated blood pressure. Patient in no acute distress. Denies chest pain, shortness of breath, lower extremity edema, vision changes, headaches. Cardiovascular exam with heart regular rate and rhythm. Normal heart sounds, no murmurs present. No lower extremity edema present. Lungs clear to auscultation bilaterally. Patient is currently taking spironolactone 50mg  daily. No refills needed. Advised patient to closely monitor blood pressure at home. Return to office sooner if blood pressure begins to increase greater than 130/80. Follow-up in 4 weeks.    - Comprehensive metabolic panel with GFR  3. Class 1 obesity due to excess  calories with serious comorbidity and body mass index (BMI) of 33.0 to 33.9 in adult Will check thyroid function, hemoglobin A1c, and lipid panel today.  - Hemoglobin A1c - Lipid panel - TSH Rfx on Abnormal to Free T4  4. Thrombocytopenia (HCC) Review of chart- patient has a history of thrombocytopenia in the past due to heavy alcohol consumption. Patient reports she is currently not drinking alcohol. Will check CBC today.  - CBC with Differential/Platelet  5. History of alcoholic hepatitis with ascites Patient reports she is not currently drinking alcohol. Denies easy bruising/bleeding, hematemesis, nausea, vomiting, abdominal pain, melena, and hematochezia. Will check PT/INR and GGT to assess liver damage/disease due to history of alcohol abuse.  - Protime-INR - Gamma GT  6. Chronic pain of both knees Will check ANA and RF due to chronic pain in both knees. Reports taking OTC medication to help with pain relief. May be reasonable to refer to rheumatology based on lab results.  - Rheumatoid Factor - ANA Direct w/Reflex if Positive  7. Nausea and vomiting, unspecified vomiting type Patient reports history of alpha-gal allergy. Will repeat panel today to determine if her nausea/vomiting is due to recent food consumption.  - Alpha-Gal Panel  8. Encounter for screening mammogram for malignant neoplasm of breast Patient due for mammogram. Order placed.  - MM 3D SCREENING MAMMOGRAM BILATERAL BREAST; Future  9. Encounter for screening for lung cancer Patient eligible for lung cancer screening. Discussed with patient and order placed.  - CT CHEST LUNG CA SCREEN LOW DOSE W/O CM; Future   Return in about 4 weeks (around 08/08/2023) for HTN follow-up.   Wilhelmena Hanson, FNP

## 2023-07-11 NOTE — Telephone Encounter (Signed)
 E[facility] called stating patient called stating [facility] pharmacy is saying they never received the prescription after today's visit

## 2023-07-12 DIAGNOSIS — R251 Tremor, unspecified: Secondary | ICD-10-CM | POA: Insufficient documentation

## 2023-07-12 DIAGNOSIS — F1090 Alcohol use, unspecified, uncomplicated: Secondary | ICD-10-CM | POA: Insufficient documentation

## 2023-07-12 DIAGNOSIS — I10 Essential (primary) hypertension: Secondary | ICD-10-CM | POA: Insufficient documentation

## 2023-07-12 DIAGNOSIS — K7460 Unspecified cirrhosis of liver: Secondary | ICD-10-CM | POA: Insufficient documentation

## 2023-07-19 LAB — COMPREHENSIVE METABOLIC PANEL WITH GFR
ALT: 48 IU/L — ABNORMAL HIGH (ref 0–32)
AST: 130 IU/L — ABNORMAL HIGH (ref 0–40)
Albumin: 3.8 g/dL (ref 3.8–4.9)
Alkaline Phosphatase: 196 IU/L — ABNORMAL HIGH (ref 44–121)
BUN/Creatinine Ratio: 11 (ref 9–23)
BUN: 7 mg/dL (ref 6–24)
Bilirubin Total: 7.2 mg/dL — ABNORMAL HIGH (ref 0.0–1.2)
CO2: 19 mmol/L — ABNORMAL LOW (ref 20–29)
Calcium: 8.8 mg/dL (ref 8.7–10.2)
Chloride: 98 mmol/L (ref 96–106)
Creatinine, Ser: 0.64 mg/dL (ref 0.57–1.00)
Globulin, Total: 3.5 g/dL (ref 1.5–4.5)
Glucose: 242 mg/dL — ABNORMAL HIGH (ref 70–99)
Potassium: 3.5 mmol/L (ref 3.5–5.2)
Sodium: 141 mmol/L (ref 134–144)
Total Protein: 7.3 g/dL (ref 6.0–8.5)
eGFR: 103 mL/min/{1.73_m2} (ref >59)

## 2023-07-19 LAB — LIPID PANEL
Chol/HDL Ratio: 2.3 ratio (ref 0.0–4.4)
Cholesterol, Total: 179 mg/dL (ref 100–199)
HDL: 79 mg/dL (ref >39)
LDL Chol Calc (NIH): 74 mg/dL (ref 0–99)
Triglycerides: 154 mg/dL — ABNORMAL HIGH (ref 0–149)
VLDL Cholesterol Cal: 26 mg/dL (ref 5–40)

## 2023-07-19 LAB — ALPHA-GAL PANEL
Allergen Lamb IgE: <0.1 kU/L
Beef IgE: <0.1 kU/L
IgE (Immunoglobulin E), Serum: 695 [IU]/mL — ABNORMAL HIGH (ref 6–495)
O215-IgE Alpha-Gal: <0.1 kU/L
Pork IgE: <0.1 kU/L

## 2023-07-19 LAB — PROTIME-INR
INR: 1.6 — ABNORMAL HIGH (ref 0.9–1.2)
Prothrombin Time: 17 s — ABNORMAL HIGH (ref 9.1–12.0)

## 2023-07-19 LAB — CBC WITH DIFFERENTIAL/PLATELET
Basophils Absolute: 0.1 10*3/uL (ref 0.0–0.2)
Basos: 2 %
EOS (ABSOLUTE): 0.1 10*3/uL (ref 0.0–0.4)
Eos: 1 %
Hematocrit: 47.9 % — ABNORMAL HIGH (ref 34.0–46.6)
Hemoglobin: 16.5 g/dL — ABNORMAL HIGH (ref 11.1–15.9)
Immature Grans (Abs): 0 10*3/uL (ref 0.0–0.1)
Immature Granulocytes: 0 %
Lymphocytes Absolute: 1.4 10*3/uL (ref 0.7–3.1)
Lymphs: 18 %
MCH: 31.5 pg (ref 26.6–33.0)
MCHC: 34.4 g/dL (ref 31.5–35.7)
MCV: 91 fL (ref 79–97)
Monocytes Absolute: 0.6 10*3/uL (ref 0.1–0.9)
Monocytes: 8 %
Neutrophils Absolute: 5.3 10*3/uL (ref 1.4–7.0)
Neutrophils: 71 %
Platelets: 79 10*3/uL — CL (ref 150–450)
RBC: 5.24 x10E6/uL (ref 3.77–5.28)
RDW: 12.9 % (ref 11.7–15.4)
WBC: 7.5 10*3/uL (ref 3.4–10.8)

## 2023-07-19 LAB — RHEUMATOID FACTOR: Rheumatoid fact SerPl-aCnc: 10.2 [IU]/mL (ref <14.0)

## 2023-07-19 LAB — HEMOGLOBIN A1C
Est. average glucose Bld gHb Est-mCnc: 223 mg/dL
Hgb A1c MFr Bld: 9.4 % — ABNORMAL HIGH (ref 4.8–5.6)

## 2023-07-19 LAB — ANA W/REFLEX IF POSITIVE: Anti Nuclear Antibody (ANA): NEGATIVE

## 2023-07-19 LAB — TSH RFX ON ABNORMAL TO FREE T4: TSH: 0.92 u[IU]/mL (ref 0.450–4.500)

## 2023-07-19 LAB — GAMMA GT: GGT: 187 IU/L — ABNORMAL HIGH (ref 0–60)

## 2023-07-20 ENCOUNTER — Ambulatory Visit: Admitting: Family Medicine

## 2023-07-20 ENCOUNTER — Other Ambulatory Visit
Admission: RE | Admit: 2023-07-20 | Discharge: 2023-07-20 | Disposition: A | Discharge status: Home / Self Care | Source: Ambulatory Visit | Attending: Family Medicine | Admitting: Family Medicine

## 2023-07-20 ENCOUNTER — Encounter: Payer: Self-pay | Admitting: Family Medicine

## 2023-07-20 VITALS — BP 119/78 | HR 66 | Ht 62.0 in | Wt 186.4 lb

## 2023-07-20 DIAGNOSIS — F1090 Alcohol use, unspecified, uncomplicated: Secondary | ICD-10-CM | POA: Diagnosis not present

## 2023-07-20 DIAGNOSIS — Z09 Encounter for follow-up examination after completed treatment for conditions other than malignant neoplasm: Secondary | ICD-10-CM | POA: Diagnosis not present

## 2023-07-20 DIAGNOSIS — D696 Thrombocytopenia, unspecified: Secondary | ICD-10-CM

## 2023-07-20 DIAGNOSIS — F17200 Nicotine dependence, unspecified, uncomplicated: Secondary | ICD-10-CM

## 2023-07-20 LAB — CBC WITH DIFFERENTIAL/PLATELET
Abs Immature Granulocytes: 0.01 10*3/uL (ref 0.00–0.07)
Basophils Absolute: 0.1 10*3/uL (ref 0.0–0.1)
Basophils Relative: 2 %
Eosinophils Absolute: 0.1 10*3/uL (ref 0.0–0.5)
Eosinophils Relative: 2 %
HCT: 42.9 % (ref 36.0–46.0)
Hemoglobin: 14.7 g/dL (ref 12.0–15.0)
Immature Granulocytes: 0 %
Lymphocytes Relative: 27 %
Lymphs Abs: 1.5 10*3/uL (ref 0.7–4.0)
MCH: 32.8 pg (ref 26.0–34.0)
MCHC: 34.3 g/dL (ref 30.0–36.0)
MCV: 95.8 fL (ref 80.0–100.0)
Monocytes Absolute: 1 10*3/uL (ref 0.1–1.0)
Monocytes Relative: 18 %
Neutro Abs: 2.9 10*3/uL (ref 1.7–7.7)
Neutrophils Relative %: 51 %
Platelets: 65 10*3/uL — ABNORMAL LOW (ref 150–400)
RBC: 4.48 MIL/uL (ref 3.87–5.11)
RDW: 15.5 % (ref 11.5–15.5)
Smear Review: NORMAL
WBC: 5.7 10*3/uL (ref 4.0–10.5)
nRBC: 0 % (ref 0.0–0.2)

## 2023-07-20 LAB — COMPREHENSIVE METABOLIC PANEL WITH GFR
ALT: 41 U/L (ref 0–44)
AST: 85 U/L — ABNORMAL HIGH (ref 15–41)
Albumin: 2.9 g/dL — ABNORMAL LOW (ref 3.5–5.0)
Alkaline Phosphatase: 109 U/L (ref 38–126)
Anion gap: 7 (ref 5–15)
BUN: 6 mg/dL (ref 6–20)
CO2: 26 mmol/L (ref 22–32)
Calcium: 8.8 mg/dL — ABNORMAL LOW (ref 8.9–10.3)
Chloride: 101 mmol/L (ref 98–111)
Creatinine, Ser: 0.5 mg/dL (ref 0.44–1.00)
GFR, Estimated: >60 mL/min (ref >60)
Glucose, Bld: 202 mg/dL — ABNORMAL HIGH (ref 70–99)
Potassium: 4 mmol/L (ref 3.5–5.1)
Sodium: 134 mmol/L — ABNORMAL LOW (ref 135–145)
Total Bilirubin: 7.1 mg/dL — ABNORMAL HIGH (ref 0.0–1.2)
Total Protein: 6.4 g/dL — ABNORMAL LOW (ref 6.5–8.1)

## 2023-07-20 LAB — MAGNESIUM: Magnesium: 1.6 mg/dL — ABNORMAL LOW (ref 1.7–2.4)

## 2023-07-20 NOTE — Patient Instructions (Addendum)
 Plan of Treatment - Upcoming Encounters Upcoming Encounters Date Type Department Care Team (Latest Contact Info) Description  07/30/2023 8:00 AM EDT Office Visit [facility] [place]  [address]  [place], [place] [address]  [phone]  Pia Mau, MD  [address]  [place], [place] [address]  [phone] (Work)  [phone] (Fax)       Below are some facilities that offer counseling services.  Please call to arrange an appointment with a counselor:                                                                    Behavioral Health/Counseling Services:  [facility]  Phone: [phone] [address] [place], [place] [address]  [facility] Phone: [phone] Various locations   [facility] Outpatient Office  Phone: [phone] [address], [address] [place], [place] [address]  [facility] [place]) Phone: [phone]  [facility] Phone: [phone] [address] [place], [place] [address]   Mental Health- Accepts Medicaid  (* = Spanish available;  + = Psychiatric services) * [facility]                            [phone]  Walk in 9am-1pm Virtual & Onsite  *+ [facility]:                                     [phone] or [phone] Virtual & Onsite  [facility]:                                              [phone] Virtual & Onsite  + [facility]:                                         [phone] Virtual & Onsite  * [facility]:                                                   [phone]   [facility]                                                    [phone] Virtual & Onsite  [facility]           [phone] Virtual   [facility]:  [phone] Virtual & Onsite  [facility]:                                      [phone] Virtual & Onsite  [facility]:                            [phone]   *[facility]                                                [phone] Virtual & Onsite  + [facility]:             [phone] or [phone]   [facility] Arista                                                 [phone] Virtual & Onsite   _______________________________________________________________________________________   Bonita Quin can also visit PsychologyToday.com to find a counselor that is within your insurance's network.  Website: https://www.psychologytoday.com/us/therapists   If you need immediate emotional support, reach out to the national mental health hotline: 988. Whether you're facing mental health struggles, emotional distress, alcohol or drug use concerns, or just need someone to talk to, our caring counselors are here for you. You are not alone.  _______________________________________________________________________________________                                                                                                                                                                                                                                                                                                       Alcohol & Drug Treatment Centers:  [facility] Phone: [phone] [address] [place], [place]  [address]  Alcoholics Anonymous  You can google "meeting guide" to find local & free meetings   [facility]  Phone: [phone] [address] [place], [place] [address]  [facility] Phone: [phone] [address] [place], [place] [address]  [facility]:                                [phone]  Alcoholics Anonymous:      [phone]   Narcotics Anonymous:       [phone]  Quit Smoking Hotline:         800-QUIT-NOW (254)801-6609)

## 2023-07-20 NOTE — Progress Notes (Signed)
   Established Patient Office Visit  Subjective  Patient ID: Marcia Strickland, female    DOB: 1965-08-02  Age: 58 y.o. MRN: [id number]  Chief Complaint  Patient presents with   Hospitalization Follow-up    Hospitalization Follow-up   Marcia Strickland is a pleasant 58 year old female patient who presents today for a hospital follow-up. She was admitted to [facility] on 07/12/2023-07/15/2023 for decompensated cirrhosis, alcohol use disorder, thrombocytopenia, tremor, hepatic encephalopathy, hypertension, alpha-gal syndrome.   She left her last office visit and was experiencing severe nausea and vomiting.  She started talking folic acid, lactulose, magnesium oxide, naltrexone, nicotine, thiamine, coreg, and spironolactone. Reports she has been and is currently abstinent from alcohol.   Hepatologist: Dr. Sherryll Burger- needs to schedule f/u appt  ROS: see HPI    Objective:    BP 119/78 (BP Location: Left Arm, Patient Position: Sitting, Cuff Size: Large)   Pulse 66   Ht 5\' 2"  (1.575 m)   Wt 186 lb 6.4 oz (84.6 kg)   SpO2 96%   BMI 34.09 kg/m  BP Readings from Last 3 Encounters:  07/20/23 119/78  07/11/23 (!) 158/102  07/09/23 133/68    Physical Exam Vitals reviewed.  Constitutional:      Appearance: Normal appearance.  Eyes:     General: Scleral icterus (slight) present.  Cardiovascular:     Rate and Rhythm: Normal rate and regular rhythm.     Pulses: Normal pulses.     Heart sounds: Normal heart sounds.  Pulmonary:     Effort: Pulmonary effort is normal.     Breath sounds: Normal breath sounds.  Skin:    Coloration: Skin is jaundiced (slight tint).  Neurological:     Mental Status: She is alert.  Psychiatric:        Mood and Affect: Mood normal.        Behavior: Behavior normal.     Assessment & Plan:    1. Hospital discharge follow-up (Primary) Patient is a pleasant 58 year old female patient who presents today for hospital follow-up. She was admitted to [facility] from 4/3-07/15/2023 for severe  N/V and alcohol withdrawal. Reviewed hospital encounter- including notes, labs, current medications and imaging. Patient is well-appearing and able to answer questions appropriately. Encouraged patient to continue with alcohol abstinence. Discussed resources to help her continue sobriety and provided her with resources. Advised patient to reach out to [facility] hepatologist to schedule a follow-up appointment due to her being high risk. Will repeat CBC, CMP, and magnesium levels today.  - CBC with Differential/Platelet - Magnesium - Comprehensive metabolic panel with GFR  2. Alcohol use disorder See #1 - CBC with Differential/Platelet - Magnesium - Comprehensive metabolic panel with GFR  3. Thrombocytopenia (HCC) See #1 - CBC with Differential/Platelet  4. Hypomagnesemia See #1 - Magnesium  5. Tobacco use disorder Patient reports she has decreased the amount she is smoking and is no longer using prescribed nicotine patches or OTC lozenges. Discussed importance of smoking cessation and provided patient with resources.     Return in about 3 months (around 10/19/2023) for chronic conditions .    Marcia Reedy, FNP

## 2023-07-23 ENCOUNTER — Encounter: Payer: Self-pay | Admitting: Family Medicine

## 2023-07-23 ENCOUNTER — Other Ambulatory Visit: Payer: Self-pay | Admitting: Family Medicine

## 2023-07-23 MED ORDER — MAGNESIUM OXIDE 400 MG PO TABS: 800.0000 mg | ORAL_TABLET | Freq: Every day | ORAL | 3 refills | Status: DC

## 2023-08-08 ENCOUNTER — Ambulatory Visit: Admitting: Family Medicine

## 2023-10-19 ENCOUNTER — Ambulatory Visit: Admitting: Family Medicine

## 2023-10-23 ENCOUNTER — Ambulatory Visit (INDEPENDENT_AMBULATORY_CARE_PROVIDER_SITE_OTHER): Admitting: Family Medicine

## 2023-10-23 ENCOUNTER — Encounter: Payer: Self-pay | Admitting: Family Medicine

## 2023-10-23 VITALS — BP 116/76 | HR 74 | Ht 62.0 in | Wt 186.4 lb

## 2023-10-23 DIAGNOSIS — K7031 Alcoholic cirrhosis of liver with ascites: Secondary | ICD-10-CM | POA: Diagnosis not present

## 2023-10-23 DIAGNOSIS — E612 Magnesium deficiency: Secondary | ICD-10-CM | POA: Diagnosis not present

## 2023-10-23 DIAGNOSIS — I8500 Esophageal varices without bleeding: Secondary | ICD-10-CM | POA: Diagnosis not present

## 2023-10-23 NOTE — Patient Instructions (Addendum)
 Smoking Cessation: QuitlineNC 1-800-QUIT-NOW (434) 254-0915); Espaol: 1-855-Djelo-Ya (8-144-664-6430) [email or web address]-castaneda.info/     Below are some facilities that offer counseling services.  Please call to arrange an appointment with a counselor:                                                                     Behavioral Health/Counseling Services:  [facility]  Phone: [phone] [address] [place], [place] [address]  [facility] Phone: [phone] Various locations   [facility] Outpatient Office  Phone: [phone] [address] [place], [place] [address]  [facility] [place]) Phone: [phone]  [facility] Phone: [phone] [address] [place], [place] [address]   Mental Health- Accepts Medicaid  (* = Spanish available;  + = Psychiatric services) * [facility]                            [phone]  Walk in 9am-1pm Virtual & Onsite  *+ [facility]:                                     [phone] or [phone] Virtual & Onsite  [facility]:                                              [phone] Virtual & Onsite  + [facility]:                                         [phone] Virtual & Onsite  * [facility]:                                                   [phone]   [facility]                                                    [phone] Virtual & Onsite  [facility]           [phone] Virtual   [facility]:                                                           [phone] Virtual & Onsite  * [facility]:  [phone] Virtual & Onsite  [facility]:                            [phone]   *[facility]                                                 [phone] Virtual & Onsite  + [facility]:             [phone] or [phone]   [facility]                                                 [phone] Virtual & Onsite   _______________________________________________________________________________________   Rosine can also visit PsychologyToday.com to find a counselor that is within your insurance's network.  Website: https://www.psychologytoday.com/us /therapists   If you need immediate emotional support, reach out to the national mental health hotline: 988. Whether you're facing mental health struggles, emotional distress, alcohol or drug use concerns, or just need someone to talk to, our caring counselors are here for you. You are not alone.  _______________________________________________________________________________________                                                                                                                                                                                                                                                                                                       Alcohol & Drug Treatment Centers:  [facility] Phone: [phone] [address] [place], [place] [address]  Alcoholics Anonymous  You can google meeting guide to find local & free meetings   [facility]  Phone: [phone] [address] [place], [place] [address]  [facility] Phone: [phone] [address] [place], [place] [address]  [facility]:                                [phone]  Alcoholics Anonymous:      [phone]  Narcotics Anonymous:       [phone]  Quit Smoking Hotline:         800-QUIT-NOW 937-119-8412)

## 2023-10-23 NOTE — Progress Notes (Signed)
 Established Patient Office Visit  Subjective  Patient ID: Marcia Strickland, female    DOB: [birth year]  Age: 58 y.o. MRN: [id number]  Chief Complaint  Patient presents with   Follow-up   Marcia Strickland is a 58 year old female patient who presents today for chronic conditions. She reports she quit drinking in April and has been doing well. Reports her mood is well controlled.  Ascites: spironolactone 25mg  daily  Esophageal varices: seen on EGD, Coreg 6.25mg  BID as primary prophylaxis  Alcohol cravings: naltrexone 50mg  PRN, mag oxide 800mg  daily, thiamine  100mg   Followed by [facility] liver specialist Q6- next appt Oct 2025. Dr. Maree  She reports she had labs done recently with Dr. Maree-- unable to see results from CBC, BMP, hepatic function, PT-INR.      10/23/2023    9:18 AM 07/11/2023    9:27 AM  GAD 7 : Generalized Anxiety Score  Nervous, Anxious, on Edge 0 2  Control/stop worrying 0 1  Worry too much - different things 0 1  Trouble relaxing 0 3  Restless 1 3  Easily annoyed or irritable 1 0  Afraid - awful might happen 0 0  Total GAD 7 Score 2 10  Anxiety Difficulty Not difficult at all Very difficult      10/23/2023    9:15 AM 07/11/2023    9:27 AM 10/17/2019   11:35 AM  PHQ9 SCORE ONLY  PHQ-9 Total Score 1 21 0   ROS: see HPI     Objective:    BP 116/76   Pulse 74   Ht 5' 2 (1.575 m)   Wt 186 lb 6.4 oz (84.6 kg)   BMI 34.09 kg/m  BP Readings from Last 3 Encounters:  10/23/23 116/76  07/20/23 119/78  07/11/23 (!) 158/102    Physical Exam Vitals reviewed.  Constitutional:      Appearance: Normal appearance.  Cardiovascular:     Rate and Rhythm: Normal rate and regular rhythm.     Pulses: Normal pulses.     Heart sounds: Normal heart sounds.  Pulmonary:     Effort: Pulmonary effort is normal.     Breath sounds: Normal breath sounds.  Neurological:     Mental Status: She is alert.  Psychiatric:        Mood and Affect: Mood normal.        Behavior: Behavior  normal.     Assessment & Plan:   1. Alcoholic cirrhosis of liver with ascites (HCC) (Primary) Followed by [facility]. Last OV 07/30/2023. Ascites is very mild and well controlled. Currently taking spironolactone 25mg  daily. She is currently sober and has been doing well independently. Congratulated and encouraged patient to continue with sobriety. Patient defers counseling for now, but will continue to address this in the future. Provided patient with substance abuse resources to help maintain sobriety. Follow-up in Oct 2025.   2. 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 Currently taking Coreg 6.25mg  BID as primary prophylaxis. Vital signs stable. Denies abdominal pain, hematemesis, melena, lightheadedness/dizziness, easy bruising/bleeding.   3. Magnesium  deficiency Patient currently taking 800mg  daily of magnesium . Review of previous magnesium  level was 1.6. Will repeat magnesium  today to see if we can decrease dose.  - Magnesium    Return in about 4 months (around 02/23/2024) for Physical with fasting labs.   Spent 30 minutes on this patient encounter, including preparation, chart review, face-to-face counseling with patient and coordination  of care, and documentation of encounter.   Marcia Arts, FNP

## 2023-10-24 ENCOUNTER — Ambulatory Visit: Payer: Self-pay | Admitting: Family Medicine

## 2023-10-24 ENCOUNTER — Other Ambulatory Visit: Payer: Self-pay | Admitting: Family Medicine

## 2023-10-24 LAB — MAGNESIUM: Magnesium: 1.5 mg/dL — ABNORMAL LOW (ref 1.6–2.3)

## 2023-10-24 MED ORDER — MAGNESIUM OXIDE 400 MG PO TABS: 800.0000 mg | ORAL_TABLET | Freq: Two times a day (BID) | ORAL | 1 refills | Status: AC

## 2023-12-13 ENCOUNTER — Ambulatory Visit: Payer: Managed Care, Other (non HMO) | Admitting: Family

## 2024-02-15 ENCOUNTER — Ambulatory Visit

## 2024-02-15 DIAGNOSIS — R7989 Other specified abnormal findings of blood chemistry: Secondary | ICD-10-CM

## 2024-02-15 DIAGNOSIS — I10 Essential (primary) hypertension: Secondary | ICD-10-CM

## 2024-02-15 DIAGNOSIS — K7031 Alcoholic cirrhosis of liver with ascites: Secondary | ICD-10-CM

## 2024-02-15 DIAGNOSIS — D696 Thrombocytopenia, unspecified: Secondary | ICD-10-CM

## 2024-02-16 LAB — CBC WITH DIFFERENTIAL/PLATELET
Basophils Absolute: 0.1 x10E3/uL (ref 0.0–0.2)
Basos: 2 %
EOS (ABSOLUTE): 0.2 x10E3/uL (ref 0.0–0.4)
Eos: 3 %
Hematocrit: 43.4 % (ref 34.0–46.6)
Hemoglobin: 14.5 g/dL (ref 11.1–15.9)
Immature Grans (Abs): 0 x10E3/uL (ref 0.0–0.1)
Immature Granulocytes: 0 %
Lymphocytes Absolute: 1.7 x10E3/uL (ref 0.7–3.1)
Lymphs: 36 %
MCH: 32.5 pg (ref 26.6–33.0)
MCHC: 33.4 g/dL (ref 31.5–35.7)
MCV: 97 fL (ref 79–97)
Monocytes Absolute: 0.6 x10E3/uL (ref 0.1–0.9)
Monocytes: 12 %
Neutrophils Absolute: 2.2 x10E3/uL (ref 1.4–7.0)
Neutrophils: 47 %
Platelets: 58 x10E3/uL — CL (ref 150–450)
RBC: 4.46 x10E6/uL (ref 3.77–5.28)
RDW: 13.2 % (ref 11.7–15.4)
WBC: 4.7 x10E3/uL (ref 3.4–10.8)

## 2024-02-16 LAB — COMPREHENSIVE METABOLIC PANEL WITH GFR
ALT: 16 IU/L (ref 0–32)
AST: 36 IU/L (ref 0–40)
Albumin: 3.5 g/dL — ABNORMAL LOW (ref 3.8–4.9)
Alkaline Phosphatase: 110 IU/L (ref 49–135)
BUN/Creatinine Ratio: 14 (ref 9–23)
BUN: 9 mg/dL (ref 6–24)
Bilirubin Total: 2.1 mg/dL — ABNORMAL HIGH (ref 0.0–1.2)
CO2: 22 mmol/L (ref 20–29)
Calcium: 9.2 mg/dL (ref 8.7–10.2)
Chloride: 106 mmol/L (ref 96–106)
Creatinine, Ser: 0.65 mg/dL (ref 0.57–1.00)
Globulin, Total: 2.9 g/dL (ref 1.5–4.5)
Glucose: 117 mg/dL — ABNORMAL HIGH (ref 70–99)
Potassium: 4.8 mmol/L (ref 3.5–5.2)
Sodium: 140 mmol/L (ref 134–144)
Total Protein: 6.4 g/dL (ref 6.0–8.5)
eGFR: 103 mL/min/1.73 (ref >59)

## 2024-02-16 LAB — TSH: TSH: 1.78 u[IU]/mL (ref 0.450–4.500)

## 2024-02-16 LAB — LIPID PANEL
Chol/HDL Ratio: 2.1 ratio (ref 0.0–4.4)
Cholesterol, Total: 151 mg/dL (ref 100–199)
HDL: 73 mg/dL (ref >39)
LDL Chol Calc (NIH): 62 mg/dL (ref 0–99)
Triglycerides: 86 mg/dL (ref 0–149)
VLDL Cholesterol Cal: 16 mg/dL (ref 5–40)

## 2024-02-16 LAB — HEMOGLOBIN A1C
Est. average glucose Bld gHb Est-mCnc: 123 mg/dL
Hgb A1c MFr Bld: 5.9 % — ABNORMAL HIGH (ref 4.8–5.6)

## 2024-02-18 ENCOUNTER — Ambulatory Visit: Payer: Self-pay | Admitting: Family Medicine

## 2024-02-18 DIAGNOSIS — D696 Thrombocytopenia, unspecified: Secondary | ICD-10-CM

## 2024-02-20 ENCOUNTER — Encounter: Payer: Self-pay | Admitting: Oncology

## 2024-02-20 ENCOUNTER — Inpatient Hospital Stay

## 2024-02-20 ENCOUNTER — Inpatient Hospital Stay: Attending: Oncology | Admitting: Oncology

## 2024-02-20 VITALS — BP 150/90 | HR 68 | Temp 97.9°F | Resp 18 | Ht 62.0 in

## 2024-02-20 DIAGNOSIS — F17210 Nicotine dependence, cigarettes, uncomplicated: Secondary | ICD-10-CM | POA: Diagnosis not present

## 2024-02-20 DIAGNOSIS — D696 Thrombocytopenia, unspecified: Secondary | ICD-10-CM | POA: Diagnosis not present

## 2024-02-20 DIAGNOSIS — Z79899 Other long term (current) drug therapy: Secondary | ICD-10-CM | POA: Insufficient documentation

## 2024-02-20 DIAGNOSIS — K7030 Alcoholic cirrhosis of liver without ascites: Secondary | ICD-10-CM | POA: Diagnosis not present

## 2024-02-20 LAB — IRON AND TIBC
Iron: 172 ug/dL — ABNORMAL HIGH (ref 28–170)
Saturation Ratios: 61 % — ABNORMAL HIGH (ref 10.4–31.8)
TIBC: 281 ug/dL (ref 250–450)
UIBC: 109 ug/dL

## 2024-02-20 LAB — CBC (CANCER CENTER ONLY)
HCT: 43.5 % (ref 36.0–46.0)
Hemoglobin: 14.6 g/dL (ref 12.0–15.0)
MCH: 31.7 pg (ref 26.0–34.0)
MCHC: 33.6 g/dL (ref 30.0–36.0)
MCV: 94.6 fL (ref 80.0–100.0)
Platelet Count: 68 K/uL — ABNORMAL LOW (ref 150–400)
RBC: 4.6 MIL/uL (ref 3.87–5.11)
RDW: 14.2 % (ref 11.5–15.5)
WBC Count: 6.1 K/uL (ref 4.0–10.5)
nRBC: 0 % (ref 0.0–0.2)

## 2024-02-20 LAB — FERRITIN: Ferritin: 331 ng/mL — ABNORMAL HIGH (ref 11–307)

## 2024-02-20 LAB — VITAMIN B12: Vitamin B-12: 515 pg/mL (ref 180–914)

## 2024-02-20 LAB — FOLATE: Folate: 15.2 ng/mL (ref >5.9)

## 2024-02-20 NOTE — Progress Notes (Unsigned)
 Patient isn't having any symptoms as of right now.

## 2024-02-20 NOTE — Progress Notes (Unsigned)
 [facility]  [phone] Fax:([phone]) [phone]  ID: Marcia Strickland OB: [birth year]  MR#: [id number]  RDW#:[id number]  Patient Care Team: Towana Small, FNP as PCP - General (Family Medicine)  CHIEF COMPLAINT: Thrombocytopenia.  INTERVAL HISTORY: Patient is a 58 year old female with a history of alcohol induced cirrhosis who was referred to clinic for evaluation of her chronic thrombocytopenia.  She currently feels well and is asymptomatic.  She does not complain of any easy bleeding or bruising.  She has a good appetite and denies weight loss.  She denies any recent fevers or illnesses.  She has no neurologic complaints.  She denies any chest pain, shortness of breath, cough, or hemoptysis.  She denies any nausea, vomiting, constipation, or diarrhea.  She has no urinary complaints.  Patient offers no specific complaints today.  REVIEW OF SYSTEMS:   Review of Systems  Constitutional: Negative.  Negative for fever, malaise/fatigue and weight loss.  Respiratory: Negative.  Negative for cough, hemoptysis and shortness of breath.   Cardiovascular: Negative.  Negative for chest pain and leg swelling.  Gastrointestinal: Negative.  Negative for abdominal pain, blood in stool and melena.  Genitourinary: Negative.  Negative for dysuria.  Musculoskeletal: Negative.  Negative for back pain.  Skin: Negative.  Negative for rash.  Neurological: Negative.  Negative for dizziness, focal weakness, weakness and headaches.  Psychiatric/Behavioral: Negative.  The patient is not nervous/anxious.     As per HPI. Otherwise, a complete review of systems is negative.  PAST MEDICAL HISTORY: Past Medical History:  Diagnosis Date   Allergy to alpha-gal    Hypertension    Liver disease    on the transplant list with [facility]   Rosacea    Scoliosis     PAST SURGICAL HISTORY: Past Surgical History:  Procedure Laterality Date   BREAST SURGERY     implants bilaterally.    TUBAL  LIGATION      FAMILY HISTORY: Family History  Problem Relation Age of Onset   Breast cancer Mother    Kidney failure Father    Breast cancer Maternal Grandmother    Leukemia Maternal Grandfather    Diabetes Maternal Grandfather    Hypertension Maternal Grandfather    CAD Paternal Grandfather    Peripheral Artery Disease Paternal Grandfather    Colon cancer Neg Hx     ADVANCED DIRECTIVES (Y/N):  N  HEALTH MAINTENANCE: Social History   Tobacco Use   Smoking status: Every Day    Current packs/day: 1.00    Average packs/day: 1 pack/day for 30.0 years (30.0 ttl pk-yrs)    Types: Cigarettes    Passive exposure: Current   Smokeless tobacco: Never  Vaping Use   Vaping status: Never Used  Substance Use Topics   Alcohol use: Not Currently    Comment: 4 drinks weekly wine   Drug use: Never     Colonoscopy:  PAP:  Bone density:  Lipid panel:  Allergies  Allergen Reactions   Alpha-Gal Nausea And Vomiting   Ketorolac  Tromethamine     Tramadol      Current Outpatient Medications  Medication Sig Dispense Refill   carvedilol (COREG) 6.25 MG tablet Take 6.25 mg by mouth 2 (two) times daily with a meal.     magnesium  oxide (MAG-OX) 400 MG tablet Take 2 tablets (800 mg total) by mouth 2 (two) times daily. 120 tablet 1   naltrexone (DEPADE) 50 MG tablet Take 1 tablet by mouth daily.     spironolactone (ALDACTONE) 25 MG tablet Take  1 tablet by mouth daily.     thiamine  (VITAMIN B1) 100 MG tablet Take 1 tablet by mouth daily.     folic acid  (FOLVITE ) 1 MG tablet Take 1 tablet by mouth daily. (Patient not taking: Reported on 02/22/2024)     No current facility-administered medications for this visit.    OBJECTIVE: Vitals:   02/20/24 1311 02/20/24 1313  BP: (!) 150/90 (!) 150/90  Pulse: 68   Resp: 18   Temp: 97.9 F (36.6 C)   SpO2: 100%      Body mass index is 34.09 kg/m.    ECOG FS:0 - Asymptomatic  General: Well-developed, well-nourished, no acute distress. Eyes:  Pink conjunctiva, anicteric sclera. HEENT: Normocephalic, moist mucous membranes. Lungs: No audible wheezing or coughing. Heart: Regular rate and rhythm. Abdomen: Soft, nontender, no obvious distention. Musculoskeletal: No edema, cyanosis, or clubbing. Neuro: Alert, answering all questions appropriately. Cranial nerves grossly intact. Skin: No rashes or petechiae noted. Psych: Normal affect. Lymphatics: No cervical, calvicular, axillary or inguinal LAD.   LAB RESULTS:  Lab Results  Component Value Date   NA 140 02/15/2024   K 4.8 02/15/2024   CL 106 02/15/2024   CO2 22 02/15/2024   GLUCOSE 117 (H) 02/15/2024   BUN 9 02/15/2024   CREATININE 0.65 02/15/2024   CALCIUM 9.2 02/15/2024   PROT 6.4 02/15/2024   ALBUMIN  3.5 (L) 02/15/2024   AST 36 02/15/2024   ALT 16 02/15/2024   ALKPHOS 110 02/15/2024   BILITOT 2.1 (H) 02/15/2024   GFRNONAA >60 07/20/2023   GFRAA NOT CALCULATED 09/17/2019    Lab Results  Component Value Date   WBC 6.1 02/20/2024   NEUTROABS 2.2 02/15/2024   HGB 14.6 02/20/2024   HCT 43.5 02/20/2024   MCV 94.6 02/20/2024   PLT 68 (L) 02/20/2024     STUDIES: No results found.  ASSESSMENT: Thrombocytopenia.  PLAN:    Thrombocytopenia: Likely secondary to alcohol consumption and alcoholic cirrhosis.  Patient's most recent imaging on July 12, 2023 did not reveal any splenomegaly.  Patient's platelet count is 68 today which appears to be approximately her baseline.  Iron stores, B12, folate, and platelet antibody profile are either negative or within normal limits.  SPEP and myeloid NGS panel are pending at time of dictation.  No intervention is needed at this time.  Patient does not require bone marrow biopsy.  Return to clinic in 3 months with repeat laboratory work and further evaluation. Cirrhosis: Continue follow-up at [facility] hepatology clinic.  I spent a total of 45 minutes reviewing chart data, face-to-face evaluation with the patient, counseling and  coordination of care as detailed above.  Patient expressed understanding and was in agreement with this plan. She also understands that She can call clinic at any time with any questions, concerns, or complaints.     Marcia JINNY Reusing, MD   02/22/2024 9:16 AM

## 2024-02-21 LAB — PLATELET ANTIBODY PROFILE
Glycoprotein IV Antibody: NEGATIVE
HLA Ab Ser Ql EIA: NEGATIVE
IA/IIA Antibody: NEGATIVE
IB/IX Antibody: NEGATIVE
IIB/IIIA Antibody: NEGATIVE

## 2024-02-22 ENCOUNTER — Ambulatory Visit (INDEPENDENT_AMBULATORY_CARE_PROVIDER_SITE_OTHER): Admitting: Family Medicine

## 2024-02-22 ENCOUNTER — Encounter: Payer: Self-pay | Admitting: Family Medicine

## 2024-02-22 ENCOUNTER — Other Ambulatory Visit (HOSPITAL_COMMUNITY)
Admission: RE | Admit: 2024-02-22 | Discharge: 2024-02-22 | Disposition: A | Discharge status: Home / Self Care | Source: Ambulatory Visit | Attending: Family Medicine | Admitting: Family Medicine

## 2024-02-22 VITALS — BP 135/84 | HR 88 | Resp 16 | Ht 62.0 in | Wt 184.6 lb

## 2024-02-22 DIAGNOSIS — Z0000 Encounter for general adult medical examination without abnormal findings: Secondary | ICD-10-CM

## 2024-02-22 DIAGNOSIS — Z1231 Encounter for screening mammogram for malignant neoplasm of breast: Secondary | ICD-10-CM

## 2024-02-22 DIAGNOSIS — Z124 Encounter for screening for malignant neoplasm of cervix: Secondary | ICD-10-CM

## 2024-02-22 DIAGNOSIS — Z122 Encounter for screening for malignant neoplasm of respiratory organs: Secondary | ICD-10-CM | POA: Diagnosis not present

## 2024-02-22 LAB — PROTEIN ELECTROPHORESIS, SERUM
A/G Ratio: 1 (ref 0.7–1.7)
Albumin ELP: 3.1 g/dL (ref 2.9–4.4)
Alpha-1-Globulin: 0.2 g/dL (ref 0.0–0.4)
Alpha-2-Globulin: 0.4 g/dL (ref 0.4–1.0)
Beta Globulin: 0.8 g/dL (ref 0.7–1.3)
Gamma Globulin: 1.7 g/dL (ref 0.4–1.8)
Globulin, Total: 3.2 g/dL (ref 2.2–3.9)
Total Protein ELP: 6.3 g/dL (ref 6.0–8.5)

## 2024-02-22 NOTE — Patient Instructions (Signed)
 Health Maintenance Recommendations Screening Testing Mammogram Every 1 -2 years based on history and risk factors Starting at age 57 Please call to schedule: [facility]  [address], [place], [place] [address] Phone: [phone] [facility] [address], [place], [place] [address] Phone: [phone] Pap Smear Ages 21-39 every 3 years Ages 32-65 every 5 years with HPV testing More frequent testing may be required based on results and history Colon Cancer Screening Every 1-10 years based on test performed, risk factors, and history Starting at age 47 Bone Density Screening Every 2-10 years based on history Starting at age 34 for women Recommendations for men differ based on medication usage, history, and risk factors AAA Screening One time ultrasound Men 44-26 years old who have every smoked Lung Cancer Screening Low Dose Lung CT every 12 months Age 14-80 years with a 30 pack-year smoking history who still smoke or who have quit within the last 15 years   Screening Labs Routine  Labs: Complete Blood Count (CBC), Complete Metabolic Panel (CMP), Cholesterol (Lipid Panel) Every 6-12 months based on history and medications May be recommended more frequently based on current conditions or previous results Hemoglobin A1c Lab Every 3-12 months based on history and previous results Starting at age 82 or earlier with diagnosis of diabetes, high cholesterol, BMI >26, and/or risk factors Frequent monitoring for patients with diabetes to ensure blood sugar control Thyroid  Panel (TSH w/ T3 & T4) Every 6 months based on history, symptoms, and risk factors May be repeated more often if on medication HIV One time testing for all patients 76 and older May be repeated more frequently for patients with increased risk factors or exposure Hepatitis C One time testing for all patients 13 and older May be repeated more frequently for patients with increased risk factors or  exposure Gonorrhea, Chlamydia Every 12 months for all sexually active persons 13-24 years Additional monitoring may be recommended for those who are considered high risk or who have symptoms PSA Men 100-42 years old with risk factors Additional screening may be recommended from age 3-69 based on risk factors, symptoms, and history   Vaccine Recommendations Tetanus Booster All adults every 10 years Flu Vaccine All patients 6 months and older every year COVID Vaccine All patients 12 years and older Initial dosing with booster May recommend additional booster based on age and health history HPV Vaccine 2 doses all patients age 53-26 Dosing may be considered for patients over 26 Shingles Vaccine (Shingrix) 2 doses all adults 55 years and older Pneumonia (Pneumovax 87) All adults 65 years and older May recommend earlier dosing based on health history Pneumonia (Prevnar 47) All adults 65 years and older Dosed 1 year after Pneumovax 23   Additional Screening, Testing, and Vaccinations may be recommended on an individualized basis based on family history, health history, risk factors, and/or exposure.  __________________________________________________________   Diet Recommendations for All Patients   I recommend that all patients maintain a diet low in saturated fats, carbohydrates, and cholesterol. While this can be challenging at first, it is not impossible and small changes can make big differences.  Things to try: Decreasing the amount of soda, sweet tea, and/or juice to one or less per day and replace with water  While water  is always the first choice, if you do not like water  you may consider adding a water  additive without sugar to improve the taste other sugar free drinks Replace potatoes with a brightly colored vegetable at dinner  Use healthy oils, such as canola oil or olive oil, instead of butter or hard margarine Limit your bread intake to two pieces or less a  day Replace regular pasta with low carb pasta options Bake, broil, or grill foods instead of frying Monitor portion sizes  Eat smaller, more frequent meals throughout the day instead of large meals   An important thing to remember is, if you love foods that are not great for your health, you don't have to give them up completely. Instead, allow these foods to be a reward when you have done well. Allowing yourself to still have special treats every once in a while is a nice way to tell yourself thank you for working hard to keep yourself healthy.    Also remember that every day is a new day. If you have a bad day and fall off the wagon, you can still climb right back up and keep moving along on your journey!   We have resources available to help you!  Some websites that may be helpful include: www.http://www.wall-moore.info/        Www.VeryWellFit.com _____________________________________________________________   Activity Recommendations for All Patients   I recommend that all adults get at least 20 minutes of moderate physical activity that elevates your heart rate at least 5 days out of the week.  Some examples include: Walking or jogging at a pace that allows you to carry on a conversation Cycling (stationary bike or outdoors) Water  aerobics Yoga Weight lifting Dancing If physical limitations prevent you from putting stress on your joints, exercise in a pool or seated in a chair are excellent options.   Do determine your MAXIMUM heart rate for activity: YOUR AGE - 220 = MAX HeartRate    Remember! Do not push yourself too hard.  Start slowly and build up your pace, speed, weight, time in exercise, etc.  Allow your body to rest between exercise and get good sleep. You will need more water  than normal when you are exerting yourself. Do not wait until you are thirsty to drink. Drink with a purpose of getting in at least 8, 8 ounce glasses of water  a day plus more depending on how much you exercise  and sweat.      If you begin to develop dizziness, chest pain, abdominal pain, jaw pain, shortness of breath, headache, vision changes, lightheadedness, or other concerning symptoms, stop the activity and allow your body to rest. If your symptoms are severe, seek emergency evaluation immediately. If your symptoms are concerning, but not severe, please let us  know so that we can recommend further evaluation.

## 2024-02-22 NOTE — Progress Notes (Signed)
 Subjective:   Marcia Strickland [birth year]  02/22/2024   CC: Chief Complaint  Patient presents with   Annual Exam    HPI: Marcia Strickland is a 58 y.o. female who presents for a routine health maintenance exam. Fasting labs collected prior to visit.   HEALTH SCREENINGS: - Vision Screening: up to date - Dental Visits: plans to schedule - Pap smear: pap done today  - Breast Exam: declined - STD Screening: Declined - Mammogram (40+): Ordered today  - Colonoscopy (45+): Up to date  - Bone Density (65+ or under 65 with predisposing conditions): Not applicable, completed 11/2019 - Lung CA screening with low-dose CT:  Ordered today Adults age 70-80 who are current cigarette smokers or quit within the last 15 years. Must have 20 pack year history.   Depression and Anxiety Screen done today and results listed below:     02/22/2024    8:23 AM 10/23/2023    9:15 AM 07/11/2023    9:27 AM 10/17/2019   11:35 AM  Depression screen PHQ 2/9  Decreased Interest 0 0 3 0  Down, Depressed, Hopeless 0 0 2 0  PHQ - 2 Score 0 0 5 0  Altered sleeping 0 1 3   Tired, decreased energy 0 0 3   Change in appetite 0 0 3   Feeling bad or failure about yourself  0 0 2   Trouble concentrating 0 0 2   Moving slowly or fidgety/restless 0 0 3   Suicidal thoughts 0 0 0   PHQ-9 Score 0 1  21    Difficult doing work/chores Not difficult at all Not difficult at all Very difficult      Data saved with a previous flowsheet row definition      02/22/2024    8:23 AM 10/23/2023    9:18 AM 07/11/2023    9:27 AM  GAD 7 : Generalized Anxiety Score  Nervous, Anxious, on Edge 0 0 2  Control/stop worrying 0 0 1  Worry too much - different things 0 0 1  Trouble relaxing 0 0 3  Restless 0 1 3  Easily annoyed or irritable 0 1 0  Afraid - awful might happen 0 0 0  Total GAD 7 Score 0 2 10  Anxiety Difficulty  Not difficult at all Very difficult   IMMUNIZATIONS: - Tdap: Tetanus vaccination status reviewed: last  tetanus booster within 10 years. - HPV: Not applicable - Influenza: Refused - Pneumovax: Not applicable - Prevnar 20: Refused - Zostavax (50+): Up to date  Past medical history, surgical history, medications, allergies, family history and social history reviewed with patient today and changes made to appropriate areas of the chart.   Past Medical History:  Diagnosis Date   Allergy to alpha-gal    Hypertension    Liver disease    on the [facility] with [facility]   Rosacea    Scoliosis     Past Surgical History:  Procedure Laterality Date   BREAST SURGERY     implants bilaterally.    TUBAL LIGATION      Current Outpatient Medications on File Prior to Visit  Medication Sig   carvedilol (COREG) 6.25 MG tablet Take 6.25 mg by mouth 2 (two) times daily with a meal.   magnesium  oxide (MAG-OX) 400 MG tablet Take 2 tablets (800 mg total) by mouth 2 (two) times daily.   naltrexone (DEPADE) 50 MG tablet Take 1 tablet by mouth daily.   spironolactone (ALDACTONE) 25  MG tablet Take 1 tablet by mouth daily.   thiamine  (VITAMIN B1) 100 MG tablet Take 1 tablet by mouth daily.   folic acid  (FOLVITE ) 1 MG tablet Take 1 tablet by mouth daily. (Patient not taking: Reported on 02/22/2024)   No current facility-administered medications on file prior to visit.    Allergies  Allergen Reactions   Alpha-Gal Nausea And Vomiting   Ketorolac  Tromethamine     Tramadol       Social History   Socioeconomic History   Marital status: Married    Spouse name: Not on file   Number of children: Not on file   Years of education: Not on file   Highest education level: Not on file  Occupational History   Occupation: Full time  Tobacco Use   Smoking status: Every Day    Current packs/day: 1.00    Average packs/day: 1 pack/day for 30.0 years (30.0 ttl pk-yrs)    Types: Cigarettes    Passive exposure: Current   Smokeless tobacco: Never  Vaping Use   Vaping status: Never Used  Substance and Sexual  Activity   Alcohol use: Not Currently    Comment: 4 drinks weekly wine   Drug use: Never   Sexual activity: Not on file  Other Topics Concern   Not on file  Social History Narrative   From [employer] for [employer].     Social Drivers of Corporate Investment Banker Strain: Low Risk (07/12/2023)   Received from [facility]   Overall Financial Resource Strain (CARDIA)    Difficulty of Paying Living Expenses: Not very hard  Food Insecurity: No Food Insecurity (07/12/2023)   Received from [facility]   Hunger Vital Sign    Within the past 12 months, you worried that your food would run out before you got the money to buy more.: Never true    Within the past 12 months, the food you bought just didn't last and you didn't have money to get more.: Never true  Transportation Needs: No Transportation Needs (07/12/2023)   [facility]    Lack of Transportation (Medical): No    Lack of Transportation (Non-Medical): No  Physical Activity: Not on file  Stress: Not on file  Social Connections: Not on file  Intimate Partner Violence: Not on file   Social History   Tobacco Use  Smoking Status Every Day   Current packs/day: 1.00   Average packs/day: 1 pack/day for 30.0 years (30.0 ttl pk-yrs)   Types: Cigarettes   Passive exposure: Current  Smokeless Tobacco Never   Social History   Substance and Sexual Activity  Alcohol Use Not Currently   Comment: 4 drinks weekly wine    Family History  Problem Relation Age of Onset   Breast cancer Mother    Kidney failure Father    Breast cancer Maternal Grandmother    Leukemia Maternal Grandfather    Diabetes Maternal Grandfather    Hypertension Maternal Grandfather    CAD Paternal Grandfather    Peripheral Artery Disease Paternal Grandfather    Colon cancer Neg Hx    ROS: Denies fever, fatigue, unexplained weight loss/gain, chest pain, SHOB, and palpitations. Denies  neurological deficits, gastrointestinal or genitourinary complaints, and skin changes.   Objective:   Today's Vitals   02/22/24 0828  BP: 135/84  Pulse: 88  Resp: 16  SpO2: 98%  Weight: 184 lb 9.6 oz (83.7 kg)  Height: 5' 2 (1.575 m)  PainSc: 0-No pain    GENERAL APPEARANCE: Well-appearing, in NAD. Well nourished.  SKIN: Pink, warm and dry. Turgor normal. No rash, lesion, ulceration, or ecchymoses. Hair evenly distributed.  HEENT: HEAD: Normocephalic.  EYES: PERRLA. EOMI. Lids intact w/o defect. Sclera white, Conjunctiva pink w/o exudate.  EARS: External ear w/o redness, swelling, masses or lesions. EAC clear. TM's intact, translucent w/o bulging, appropriate landmarks visualized. Appropriate acuity to conversational tones.  NOSE: Septum midline w/o deformity. Nares patent, mucosa pink and non-inflamed w/o drainage. No sinus tenderness.  THROAT: Uvula midline. Oropharynx clear. Tonsils non-inflamed w/o exudate. Oral mucosa pink and moist.  NECK: Supple, Trachea midline. Full ROM w/o pain or tenderness. No lymphadenopathy. Thyroid non-tender w/o enlargement or palpable masses.  BREASTS: Deferred.  RESPIRATORY: Chest wall symmetrical w/o masses. Respirations even and non-labored. Breath sounds clear to auscultation bilaterally. No wheezes, rales, rhonchi, or crackles. CARDIAC: S1, S2 present, regular rate and rhythm. No gallops, murmurs, rubs, or clicks. PMI w/o lifts, heaves, or thrills. No carotid bruits. Capillary refill <2 seconds. Peripheral pulses 2+ bilaterally. GI: Abdomen soft w/o distention. Normoactive bowel sounds. No palpable masses or tenderness. No guarding or rebound tenderness. Liver and spleen w/o tenderness or enlargement. No CVA tenderness.  GU: External genitalia without erythema, lesions, or masses. No lymphadenopathy. Vaginal mucosa pink and moist without exudate, lesions, or ulcerations. Cervix pink without discharge. Cervical os closed. Uterus and adnexae palpable,  not enlarged, and w/o tenderness. No palpable masses.  MSK: Muscle tone and strength appropriate for age, w/o atrophy or abnormal movement.  EXTREMITIES: Active ROM intact, w/o tenderness, crepitus, or contracture. No obvious joint deformities or effusions. No clubbing, edema, or cyanosis.  NEUROLOGIC: CN's II-XII intact. Motor strength symmetrical with no obvious weakness. No sensory deficits. DTR's 2+ symmetric bilaterally. Steady, even gait.  PSYCH/MENTAL STATUS: Alert, oriented x 3. Cooperative, appropriate mood and affect.   Chaperoned by Warren Chiles, CMA   Results for orders placed or performed in visit on 02/20/24  Folic Acid  (Folate)   Collection Time: 02/20/24  1:52 PM  Result Value Ref Range   Folate 15.2 >5.9 ng/mL  Iron and TIBC   Collection Time: 02/20/24  1:52 PM  Result Value Ref Range   Iron 172 (H) 28 - 170 ug/dL   TIBC 718 749 - 549 ug/dL   Saturation Ratios 61 (H) 10.4 - 31.8 %   UIBC 109 ug/dL  Ferritin   Collection Time: 02/20/24  1:52 PM  Result Value Ref Range   Ferritin 331 (H) 11 - 307 ng/mL  Platelet Antibody Profile   Collection Time: 02/20/24  1:52 PM  Result Value Ref Range   HLA Ab Ser Ql EIA Negative Negative   IIB/IIIA Antibody Negative Negative   IB/IX Antibody Negative Negative   IA/IIA Antibody Negative Negative   Glycoprotein IV Antibody Negative Negative  CBC (Cancer Center Only)   Collection Time: 02/20/24  1:52 PM  Result Value Ref Range   WBC Count 6.1 4.0 - 10.5 K/uL   RBC 4.60 3.87 - 5.11 MIL/uL   Hemoglobin 14.6 12.0 - 15.0 g/dL   HCT 56.4 63.9 - 53.9 %   MCV 94.6 80.0 - 100.0 fL   MCH 31.7 26.0 - 34.0 pg   MCHC 33.6 30.0 - 36.0 g/dL   RDW 85.7 88.4 - 84.4 %   Platelet Count 68 (L) 150 - 400 K/uL   nRBC 0.0 0.0 - 0.2 %  Vitamin B12   Collection Time:  02/20/24  1:53 PM  Result Value Ref Range   Vitamin B-12 515 180 - 914 pg/mL    Assessment & Plan:   1. Wellness examination (Primary) - Encouraged a healthy well-balanced  diet. Patient may adjust caloric intake to maintain or achieve ideal body weight. May reduce intake of dietary saturated fat and total fat and have adequate dietary potassium and calcium preferably from fresh fruits, vegetables, and low-fat dairy products.   - Advised to avoid cigarette smoking. - Discussed with the patient that most people either abstain from alcohol or drink within safe limits (<=14/week and <=4 drinks/occasion for males, <=7/weeks and <= 3 drinks/occasion for females) and that the risk for alcohol disorders and other health effects rises proportionally with the number of drinks per week and how often a drinker exceeds daily limits. - Discussed cessation/primary prevention of drug use and availability of treatment for abuse.  - Discussed sexually transmitted diseases, avoidance of unintended pregnancy and contraceptive alternatives. - Stressed the importance of regular exercise - Injury prevention: Discussed safety belts, safety helmets, smoke detector, smoking near bedding or upholstery.  - Dental health: Discussed importance of regular tooth brushing, flossing, and dental visits.   2. Cervical cancer screening Visual inspection of external genitalia of vagina. No inguinal lymphadenopathy palpated. Vaginal walls and cervix appear pink during speculum examination. Cervical os visualized. Pap completed with no complications. Will update patient with results.  - Cytology - PAP  3. Screening mammogram for breast cancer Due for screening mammogram. Order placed.  - MM 3D SCREENING MAMMOGRAM BILATERAL BREAST; Future  4. Screening for lung cancer Current smoker that is eligible for cancer screening. Order placed for lung cancer screening.  - Ambulatory Referral for Lung Cancer Screening [REF832]   Return in about 4 months (around 06/21/2024) for chronic conditions .  Patient to reach out to office if new, worrisome, or unresolved symptoms arise or if no improvement in patient's  condition. Patient verbalized understanding and is agreeable to treatment plan. All questions answered to patient's satisfaction.   Evalene Arts, FNP

## 2024-02-25 LAB — CYTOLOGY - PAP
Comment: NEGATIVE
Diagnosis: NEGATIVE
High risk HPV: NEGATIVE

## 2024-03-03 ENCOUNTER — Ambulatory Visit: Payer: Self-pay | Admitting: Family Medicine

## 2024-03-08 LAB — MYELOID NGS

## 2024-05-15 ENCOUNTER — Telehealth: Payer: Self-pay | Admitting: Oncology

## 2024-05-15 NOTE — Telephone Encounter (Signed)
 Pt called to r/s 2/11 appts  - pt will be out of town - said she needed to push appt back to early April [facility]

## 2024-05-21 ENCOUNTER — Inpatient Hospital Stay

## 2024-05-21 ENCOUNTER — Inpatient Hospital Stay: Admitting: Oncology

## 2024-06-13 ENCOUNTER — Ambulatory Visit: Admitting: Family Medicine

## 2024-07-16 ENCOUNTER — Inpatient Hospital Stay: Admitting: Oncology

## 2024-07-16 ENCOUNTER — Inpatient Hospital Stay
